# Patient Record
Sex: Female | Born: 1945
Health system: Southern US, Community
[De-identification: ages and names within clinical notes are randomized; demographics above are authoritative.]

## PROBLEM LIST (undated history)

## (undated) DIAGNOSIS — G4730 Sleep apnea, unspecified: Secondary | ICD-10-CM

## (undated) DIAGNOSIS — M1990 Unspecified osteoarthritis, unspecified site: Secondary | ICD-10-CM

## (undated) DIAGNOSIS — I10 Essential (primary) hypertension: Secondary | ICD-10-CM

## (undated) DIAGNOSIS — R0600 Dyspnea, unspecified: Secondary | ICD-10-CM

## (undated) HISTORY — PX: ABDOMINAL HYSTERECTOMY: SHX81

## (undated) HISTORY — PX: CHOLECYSTECTOMY: SHX55

---

## 2004-11-17 ENCOUNTER — Encounter: Payer: Self-pay | Admitting: General Practice

## 2004-11-30 ENCOUNTER — Encounter: Payer: Self-pay | Admitting: General Practice

## 2005-08-02 HISTORY — PX: KNEE ARTHROSCOPY: SHX127

## 2013-08-30 ENCOUNTER — Ambulatory Visit: Payer: Self-pay | Admitting: Physician Assistant

## 2015-06-03 ENCOUNTER — Ambulatory Visit (INDEPENDENT_AMBULATORY_CARE_PROVIDER_SITE_OTHER): Payer: PPO

## 2015-06-03 ENCOUNTER — Ambulatory Visit
Admission: EM | Admit: 2015-06-03 | Discharge: 2015-06-03 | Disposition: A | Discharge status: Home / Self Care | Payer: PPO | Attending: Family Medicine | Admitting: Family Medicine

## 2015-06-03 ENCOUNTER — Encounter: Payer: Self-pay | Admitting: Emergency Medicine

## 2015-06-03 DIAGNOSIS — G8929 Other chronic pain: Secondary | ICD-10-CM

## 2015-06-03 DIAGNOSIS — M545 Low back pain, unspecified: Secondary | ICD-10-CM

## 2015-06-03 DIAGNOSIS — M25562 Pain in left knee: Secondary | ICD-10-CM

## 2015-06-03 DIAGNOSIS — M47819 Spondylosis without myelopathy or radiculopathy, site unspecified: Secondary | ICD-10-CM

## 2015-06-03 DIAGNOSIS — M129 Arthropathy, unspecified: Secondary | ICD-10-CM

## 2015-06-03 DIAGNOSIS — M1712 Unilateral primary osteoarthritis, left knee: Secondary | ICD-10-CM

## 2015-06-03 MED ORDER — CELECOXIB 200 MG PO CAPS: 200.0000 mg | ORAL_CAPSULE | Freq: Two times a day (BID) | ORAL | Status: DC

## 2015-06-03 NOTE — Discharge Instructions (Signed)
Arthritis Arthritis means joint pain. It can also mean joint disease. A joint is a place where bones come together. People who have arthritis may have:  Red joints.  Swollen joints.  Stiff joints.  Warm joints.  A fever.  A feeling of being sick. HOME CARE Pay attention to any changes in your symptoms. Take these actions to help with your pain and swelling. Medicines  Take over-the-counter and prescription medicines only as told by your doctor.  Do not take aspirin for pain if your doctor says that you may have gout. Activities  Rest your joint if your doctor tells you to.  Avoid activities that make the pain worse.  Exercise your joint regularly as told by your doctor. Try doing exercises like:  Swimming.  Water aerobics.  Biking.  Walking. Joint Care 1. If your joint is swollen, keep it raised (elevated) if told by your doctor. 2. If your joint feels stiff in the morning, try taking a warm shower. 3. If you have diabetes, do not apply heat without asking your doctor. 4. If told, apply heat to the joint: 1. Put a towel between the joint and the hot pack or heating pad. 2. Leave the heat on the area for 20-30 minutes. 5. If told, apply ice to the joint: 1. Put ice in a plastic bag. 2. Place a towel between your skin and the bag. 3. Leave the ice on for 20 minutes, 2-3 times per day. 6. Keep all follow-up visits as told by your doctor. GET HELP IF: 1. The pain gets worse. 2. You have a fever. GET HELP RIGHT AWAY IF: 1. You have very bad pain in your joint. 2. You have swelling in your joint. 3. Your joint is red. 4. Many joints become painful and swollen. 5. You have very bad back pain. 6. Your leg is very weak. 7. You cannot control your pee (urine) or poop (stool).   This information is not intended to replace advice given to you by your health care provider. Make sure you discuss any questions you have with your health care provider.   Document  Released: 10/13/2009 Document Revised: 04/09/2015 Document Reviewed: 10/14/2014 Elsevier Interactive Patient Education 2016 [employer]  Back Exercises If you have pain in your back, do these exercises 2-3 times each day or as told by your doctor. When the pain goes away, do the exercises once each day, but repeat the steps more times for each exercise (do more repetitions). If you do not have pain in your back, do these exercises once each day or as told by your doctor. EXERCISES Single Knee to Chest Do these steps 3-5 times in a row for each leg:  Lie on your back on a firm bed or the floor with your legs stretched out.  Bring one knee to your chest.  Hold your knee to your chest by grabbing your knee or thigh.  Pull on your knee until you feel a gentle stretch in your lower back.  Keep doing the stretch for 10-30 seconds.  Slowly let go of your leg and straighten it. Pelvic Tilt Do these steps 5-10 times in a row:  Lie on your back on a firm bed or the floor with your legs stretched out.  Bend your knees so they point up to the ceiling. Your feet should be flat on the floor.  Tighten your lower belly (abdomen) muscles to press your lower back against the floor. This will make your tailbone point  up to the ceiling instead of pointing down to your feet or the floor.  Stay in this position for 5-10 seconds while you gently tighten your muscles and breathe evenly. Cat-Cow Do these steps until your lower back bends more easily:  Get on your hands and knees on a firm surface. Keep your hands under your shoulders, and keep your knees under your hips. You may put padding under your knees.  Let your head hang down, and make your tailbone point down to the floor so your lower back is round like the back of a cat.  Stay in this position for 5 seconds.  Slowly lift your head and make your tailbone point up to the ceiling so your back hangs low (sags) like the back of a cow.  Stay  in this position for 5 seconds. Press-Ups Do these steps 5-10 times in a row: 7. Lie on your belly (face-down) on the floor. 8. Place your hands near your head, about shoulder-width apart. 9. While you keep your back relaxed and keep your hips on the floor, slowly straighten your arms to raise the top half of your body and lift your shoulders. Do not use your back muscles. To make yourself more comfortable, you may change where you place your hands. 10. Stay in this position for 5 seconds. 11. Slowly return to lying flat on the floor. Bridges Do these steps 10 times in a row: 3. Lie on your back on a firm surface. 4. Bend your knees so they point up to the ceiling. Your feet should be flat on the floor. 5. Tighten your butt muscles and lift your butt off of the floor until your waist is almost as high as your knees. If you do not feel the muscles working in your butt and the back of your thighs, slide your feet 1-2 inches farther away from your butt. 6. Stay in this position for 3-5 seconds. 7. Slowly lower your butt to the floor, and let your butt muscles relax. If this exercise is too easy, try doing it with your arms crossed over your chest. Belly Crunches Do these steps 5-10 times in a row: 8. Lie on your back on a firm bed or the floor with your legs stretched out. 9. Bend your knees so they point up to the ceiling. Your feet should be flat on the floor. 10. Cross your arms over your chest. 11. Tip your chin a little bit toward your chest but do not bend your neck. 12. Tighten your belly muscles and slowly raise your chest just enough to lift your shoulder blades a tiny bit off of the floor. 13. Slowly lower your chest and your head to the floor. Back Lifts Do these steps 5-10 times in a row: 1. Lie on your belly (face-down) with your arms at your sides, and rest your forehead on the floor. 2. Tighten the muscles in your legs and your butt. 3. Slowly lift your chest off of the floor  while you keep your hips on the floor. Keep the back of your head in line with the curve in your back. Look at the floor while you do this. 4. Stay in this position for 3-5 seconds. 5. Slowly lower your chest and your face to the floor. GET HELP IF:  Your back pain gets a lot worse when you do an exercise.  Your back pain does not lessen 2 hours after you exercise. If you have any of these problems, stop  doing the exercises. Do not do them again unless your doctor says it is okay. GET HELP RIGHT AWAY IF:  You have sudden, very bad back pain. If this happens, stop doing the exercises. Do not do them again unless your doctor says it is okay.   This information is not intended to replace advice given to you by your health care provider. Make sure you discuss any questions you have with your health care provider.   Document Released: 08/21/2010 Document Revised: 04/09/2015 Document Reviewed: 09/12/2014 Elsevier Interactive Patient Education 2016 [employer].  Joint Pain Joint pain can be caused by many things. The joint can be bruised, infected, weak from aging, or sore from exercise. The pain will probably go away if you follow your doctor's instructions for home care. If your joint pain continues, more tests may be needed to help find the cause of your condition. HOME CARE Watch your condition for any changes. Follow these instructions as told to lessen the pain that you are feeling:  Take medicines only as told by your doctor.  Rest the sore joint for as long as told by your doctor. If your doctor tells you to, raise (elevate) the painful joint above the level of your heart while you are sitting or lying down.  Do not do things that cause pain or make the pain worse.  If told, put ice on the painful area:  Put ice in a plastic bag.  Place a towel between your skin and the bag.  Leave the ice on for 20 minutes, 2-3 times per day.  Wear an elastic bandage, splint, or sling as told  by your doctor. Loosen the bandage or splint if your fingers or toes lose feeling (become numb) and tingle, or if they turn cold and blue.  Begin exercising or stretching the joint as told by your doctor. Ask your doctor what types of exercise are safe for you.  Keep all follow-up visits as told by your doctor. This is important. GET HELP IF:  Your pain gets worse and medicine does not help it.  Your joint pain does not get better in 3 days.  You have more bruising or swelling.  You have a fever.  You lose 10 pounds (4.5 kg) or more without trying. GET HELP RIGHT AWAY IF:  You are not able to move the joint.  Your fingers or toes become numb or they turn cold and blue.   This information is not intended to replace advice given to you by your health care provider. Make sure you discuss any questions you have with your health care provider.   Document Released: 07/07/2009 Document Revised: 08/09/2014 Document Reviewed: 04/30/2014 Elsevier Interactive Patient Education 2016 [employer] Inc[employer]  Knee Pain Knee pain is a common problem. It can have many causes. The pain often goes away by following your doctor's home care instructions. Treatment for ongoing pain will depend on the cause of your pain. If your knee pain continues, more tests may be needed to diagnose your condition. Tests may include X-rays or other imaging studies of your knee. HOME CARE  Take medicines only as told by your doctor.  Rest your knee and keep it raised (elevated) while you are resting.  Do not do things that cause pain or make your pain worse.  Avoid activities where both feet leave the ground at the same time, such as running, jumping rope, or doing jumping jacks.  Apply ice to the knee area:  Put ice  in a plastic bag.  Place a towel between your skin and the bag.  Leave the ice on for 20 minutes, 2-3 times a day.  Ask your doctor if you should wear an elastic knee support.  Sleep with a pillow  under your knee.  Lose weight if you are overweight. Being overweight can make your knee hurt more.  Do not use any tobacco products, including cigarettes, chewing tobacco, or electronic cigarettes. If you need help quitting, ask your doctor. Smoking may slow the healing of any bone and joint problems that you may have. GET HELP IF:  Your knee pain does not stop, it changes, or it gets worse.  You have a fever along with knee pain.  Your knee gives out or locks up.  Your knee becomes more swollen. GET HELP RIGHT AWAY IF:   Your knee feels hot to the touch.  You have chest pain or trouble breathing.   This information is not intended to replace advice given to you by your health care provider. Make sure you discuss any questions you have with your health care provider.   Document Released: 10/15/2008 Document Revised: 08/09/2014 Document Reviewed: 09/19/2013 Elsevier Interactive Patient Education [employer]

## 2015-06-03 NOTE — ED Notes (Signed)
Patient states she has been having left knee and back pain since June. Patient saw her PCP and got a muscle relaxer but it didn't help.

## 2015-06-03 NOTE — ED Provider Notes (Signed)
CSN: [id number]     Arrival date & time 06/03/15  7829 History   First MD Initiated Contact with Patient 06/03/15 0932    Nurses notes were reviewed. Chief Complaint  Patient presents with  . Knee Pain   she reports having knee pain for months. She started having back pain but to 3 months ago she's had a history of previous injury to the back which was pregnant falling down some steps which is over 30 years ago she also reports Dr. Ivar Drape timing of that she had some degenerative changes in the spine. She saw PCP a few months ago because of pain in the back and he talked about placed on narcotic which she wasn't too thrilled with. Asked to 3 days increased not only left knee pain and also back pain last night she laid in bed and sleep through the night because of pain in the back and in the left knee. She is a point she wants some answers as far as was causing the pain in the back and left knee. (Consider location/radiation/quality/duration/timing/severity/associated sxs/prior Treatment) Patient is a 69 y.o. female presenting with knee pain. The history is provided by the patient. No language interpreter was used.  Knee Pain Location:  Knee Knee location:  L knee Pain details:    Quality:  Aching and throbbing   Radiates to:  Does not radiate   Severity:  Moderate   Onset quality:  Unable to specify   Timing:  Intermittent   Progression:  Worsening Chronicity:  Chronic Foreign body present:  No foreign bodies Prior injury to area:  No Relieved by:  Nothing Worsened by:  Activity and bearing weight Associated symptoms: back pain, decreased ROM and stiffness   Associated symptoms: no muscle weakness and no neck pain   Risk factors: no concern for non-accidental trauma, no frequent fractures, no known bone disorder, no obesity and no recent illness     History reviewed. No pertinent past medical history. Past Surgical History  Procedure Laterality Date  . Knee arthroscopy Right 2007  .  Cholecystectomy     History reviewed. No pertinent family history. Social History  Substance Use Topics  . Smoking status: Light Tobacco Smoker  . Smokeless tobacco: None  . Alcohol Use: No   OB History    No data available     Review of Systems  Musculoskeletal: Positive for back pain and stiffness. Negative for neck pain.  All other systems reviewed and are negative.   Allergies  Contrast media  Home Medications   Prior to Admission medications   Medication Sig Start Date End Date Taking? Authorizing Provider  celecoxib (CELEBREX) 200 MG capsule Take 1 capsule (200 mg total) by mouth 2 (two) times daily. 06/03/15   Hassan Rowan, MD   Meds Ordered and Administered this Visit  Medications - No data to display  BP 143/78 mmHg  Pulse 87  Temp(Src) 96.2 F (35.7 C) (Tympanic)  Resp 18  Ht  (1.6 m)  Wt 199 lb (90.266 kg)  BMI 35.26 kg/m2  SpO2 99% No data found.   Physical Exam  Constitutional: She is oriented to person, place, and time. She appears well-developed and well-nourished.  HENT:  Head: Normocephalic and atraumatic.  Eyes: Conjunctivae are normal. Pupils are equal, round, and reactive to light.  Musculoskeletal: Normal range of motion. She exhibits tenderness. She exhibits no edema.       Left knee: She exhibits swelling. Tenderness found.  Lumbar back: She exhibits tenderness and pain.       Legs: Pain tenderness lower back and sacral coccyx area. Left knee is also tender to palpation slightly swollen  Neurological: She is alert and oriented to person, place, and time. No cranial nerve deficit.  Skin: Skin is warm and dry.  Psychiatric: She has a normal mood and affect. Her behavior is normal.  Vitals reviewed.   ED Course  Procedures (including critical care time)  Labs Review Labs Reviewed - No data to display  Imaging Review Dg Lumbar Spine Complete  06/03/2015  CLINICAL DATA:  Low back and sacral pain for 2 months, no acute injury  EXAM: LUMBAR SPINE - COMPLETE 4+ VIEW COMPARISON:  None. FINDINGS: The lumbar vertebrae are in normal alignment. There is degenerative disc disease at the L5-S1 level where there is loss of disc space, sclerosis, and spurring present. No compression deformity is seen. There is degenerative change involving the facet joints particularly of L4-5 and L5-S1. The SI joints are corticated. The bowel gas pattern is nonspecific. IMPRESSION: 1. Normal alignment with no acute abnormality. 2. Degenerative disc disease at L5-S1. Electronically Signed   By: Dwyane Dee M.D.   On: 06/03/2015 11:31   Dg Sacrum/coccyx  06/03/2015  CLINICAL DATA:  Low back and sacral pain for 2 months, no acute injury EXAM: SACRUM AND COCCYX - 2+ VIEW COMPARISON:  None. FINDINGS: The bones appear somewhat osteopenic. The SI joints are corticated and the sacral foramina appear normal. The pelvic rami are intact. There is degenerative disc disease at L5-S1 where there is loss of disc space, sclerosis, and spurring present. The sacrococcygeal elements are in normal alignment. IMPRESSION: 1. No acute fracture. 2. Degenerative disc disease at L5-S1. Electronically Signed   By: Dwyane Dee M.D.   On: 06/03/2015 11:30   Dg Knee Complete 4 Views Left  06/03/2015  CLINICAL DATA:  69 year old female with anterior left knee pain for the past 2 months. Hurts to bear weight. No history of recent injury. Initial encounter. EXAM: LEFT KNEE - COMPLETE 4+ VIEW COMPARISON:  None. FINDINGS: Moderate to marked medial tibiofemoral joint space degenerative changes with osteophyte formation and mild subchondral sclerosis. Slightly high riding patella. Mild to slightly moderate patellofemoral joint degenerative changes. Mild lateral tibial femoral joint space changes. Small joint effusion. No fracture or dislocation. IMPRESSION: Moderate to marked medial tibiofemoral joint space degenerative changes with osteophyte formation and mild subchondral sclerosis. Slightly  high riding patella. Mild to slightly moderate patellofemoral joint degenerative changes. Mild lateral tibial femoral joint space changes. Small joint effusion. Electronically Signed   By: Lacy Duverney M.D.   On: 06/03/2015 11:30     Visual Acuity Review  Right Eye Distance:   Left Eye Distance:   Bilateral Distance:    Right Eye Near:   Left Eye Near:    Bilateral Near:         MDM   1. Knee pain, chronic, left   2. Back pain at L4-L5 level   3. Arthritis, low back   4. Arthritis of knee, left     Patient was given expiration that she has severe arthritis of the knee and because she has authorized the knee sure that suspecting her gait making her back worse when turned that we need to get her to an orthopedic to have any evaluated cortisone injection and possible knee replacements by me needed and until those things done that she's got have chronic back pain. Discussed with  her anti-inflammatories I do not like Motrin or Naprosyn in the elderly patients but we can use a low-dose Celebrex 200 mg until she sees the orthopedic. She has been to trying orthopedic to have a right knee operated on so she is going to go there and will try to get the phone number so she can make a call to get an appointment. She had to leave to take care of her disabled husband assists her come back and get the films and discharge papers and prescription.    Hassan RowanEugene Cailen Texeira, MD 06/03/15 [phone]

## 2015-06-16 DIAGNOSIS — G4733 Obstructive sleep apnea (adult) (pediatric): Secondary | ICD-10-CM | POA: Insufficient documentation

## 2015-07-08 DIAGNOSIS — M47816 Spondylosis without myelopathy or radiculopathy, lumbar region: Secondary | ICD-10-CM | POA: Insufficient documentation

## 2015-08-06 DIAGNOSIS — G4733 Obstructive sleep apnea (adult) (pediatric): Secondary | ICD-10-CM | POA: Diagnosis not present

## 2015-08-06 DIAGNOSIS — H60331 Swimmer's ear, right ear: Secondary | ICD-10-CM | POA: Diagnosis not present

## 2015-08-18 DIAGNOSIS — G4733 Obstructive sleep apnea (adult) (pediatric): Secondary | ICD-10-CM | POA: Diagnosis not present

## 2015-08-27 DIAGNOSIS — G4733 Obstructive sleep apnea (adult) (pediatric): Secondary | ICD-10-CM | POA: Diagnosis not present

## 2015-09-15 DIAGNOSIS — L218 Other seborrheic dermatitis: Secondary | ICD-10-CM | POA: Diagnosis not present

## 2015-09-15 DIAGNOSIS — L2489 Irritant contact dermatitis due to other agents: Secondary | ICD-10-CM | POA: Diagnosis not present

## 2015-09-15 DIAGNOSIS — L719 Rosacea, unspecified: Secondary | ICD-10-CM | POA: Diagnosis not present

## 2015-09-27 DIAGNOSIS — G4733 Obstructive sleep apnea (adult) (pediatric): Secondary | ICD-10-CM | POA: Diagnosis not present

## 2015-10-25 DIAGNOSIS — G4733 Obstructive sleep apnea (adult) (pediatric): Secondary | ICD-10-CM | POA: Diagnosis not present

## 2015-11-25 DIAGNOSIS — G4733 Obstructive sleep apnea (adult) (pediatric): Secondary | ICD-10-CM | POA: Diagnosis not present

## 2015-12-25 DIAGNOSIS — G4733 Obstructive sleep apnea (adult) (pediatric): Secondary | ICD-10-CM | POA: Diagnosis not present

## 2016-01-25 DIAGNOSIS — G4733 Obstructive sleep apnea (adult) (pediatric): Secondary | ICD-10-CM | POA: Diagnosis not present

## 2016-02-24 DIAGNOSIS — G4733 Obstructive sleep apnea (adult) (pediatric): Secondary | ICD-10-CM | POA: Diagnosis not present

## 2016-03-03 ENCOUNTER — Encounter: Payer: Self-pay | Admitting: Emergency Medicine

## 2016-03-03 ENCOUNTER — Ambulatory Visit
Admission: EM | Admit: 2016-03-03 | Discharge: 2016-03-03 | Disposition: A | Discharge status: Home / Self Care | Payer: PPO | Attending: Emergency Medicine | Admitting: Emergency Medicine

## 2016-03-03 ENCOUNTER — Ambulatory Visit (INDEPENDENT_AMBULATORY_CARE_PROVIDER_SITE_OTHER): Payer: PPO

## 2016-03-03 DIAGNOSIS — J988 Other specified respiratory disorders: Secondary | ICD-10-CM

## 2016-03-03 DIAGNOSIS — R059 Cough, unspecified: Secondary | ICD-10-CM

## 2016-03-03 DIAGNOSIS — R05 Cough: Secondary | ICD-10-CM | POA: Diagnosis not present

## 2016-03-03 DIAGNOSIS — R0602 Shortness of breath: Secondary | ICD-10-CM | POA: Diagnosis not present

## 2016-03-03 DIAGNOSIS — J22 Unspecified acute lower respiratory infection: Secondary | ICD-10-CM

## 2016-03-03 HISTORY — DX: Unspecified osteoarthritis, unspecified site: M19.90

## 2016-03-03 MED ORDER — HYDROCOD POLST-CPM POLST ER 10-8 MG/5ML PO SUER: 5.0000 mL | Freq: Two times a day (BID) | ORAL | 0 refills | Status: DC | PRN

## 2016-03-03 MED ORDER — IPRATROPIUM BROMIDE 0.06 % NA SOLN: 2.0000 | Freq: Four times a day (QID) | NASAL | 0 refills | Status: DC

## 2016-03-03 MED ORDER — AZITHROMYCIN 250 MG PO TABS: 250.0000 mg | ORAL_TABLET | Freq: Every day | ORAL | 0 refills | Status: DC

## 2016-03-03 MED ORDER — BENZONATATE 200 MG PO CAPS: 200.0000 mg | ORAL_CAPSULE | Freq: Three times a day (TID) | ORAL | 0 refills | Status: DC | PRN

## 2016-03-03 MED ORDER — AEROCHAMBER PLUS MISC: 2 refills | Status: DC

## 2016-03-03 MED ORDER — ALBUTEROL SULFATE HFA 108 (90 BASE) MCG/ACT IN AERS: 1.0000 | INHALATION_SPRAY | Freq: Four times a day (QID) | RESPIRATORY_TRACT | 0 refills | Status: DC | PRN

## 2016-03-03 NOTE — ED Triage Notes (Signed)
Pt reports coughing since Saturday, difficulty sleeping, SOB, and rib pain when coughs.

## 2016-03-03 NOTE — Discharge Instructions (Signed)
Take the medication as written. Take 1 gram of tylenol with the 400 mg of motrin up to 3-4 times a day as needed for pain and fever. This is an effective combination. Drink extra fluids. Use a neti pot or the NeilMed sinus rinse as often as you want to to reduce nasal congestion. Follow the directions on the box. 1-2 puffs from your albuterol inhaler every 4-6 hours as needed for coughing. Be careful with the Tussionex as it has a narcotic in it and this could increase your risk for falls.  Go to www.goodrx.com to look up your medications. This will give you a list of where you can find your prescriptions at the most affordable prices.

## 2016-03-03 NOTE — ED Provider Notes (Signed)
HPI  SUBJECTIVE:  Kaitlyn Sanders is a 70 y.o. female who presents with a nonproductive cough for 4 days. She states that she cannot sleep secondary to the cough. She reports ear pain, sinus pain and pressure with coughing, chest soreness with coughing, nasal congestion, clear rhinorrhea, postnasal drip. She reports fevers Tmax 102 yesterday. She reports fatigue secondary to not being able to sleep. She denies change in hearing, otorrhea, foreign body insertion. She reports wheezing, other chest pain, or other shortness of breath. No dyspnea on exertion. No bodyaches. No lower extremity edema, unintentional weight gain, nocturia, orthopnea, PND. No abdominal complaints or pain. 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 She has tried aspirin or ibuprofen once a day, last dose was last night. No alleviating factors. Symptoms worse with lying down because this triggers for coughing. No tick bite, no antipyretic in the past 6-8 hours. No posttussive emesis. Past medical history of obstructive sleep apnea on CPAP, 56 year history of smoking 3-4 cigarettes per day, pneumonia. No history of asthma, emphysema, COPD, diabetes, hypertension, CHF, kidney disease, GI bleed, peptic ulcer disease. PMD: Dr. Elmer Ramp.    Past Medical History:  Diagnosis Date  . Arthritis     Past Surgical History:  Procedure Laterality Date  . ABDOMINAL HYSTERECTOMY    . CHOLECYSTECTOMY    . KNEE ARTHROSCOPY Right 2007    History reviewed. No pertinent family history.  Social History  Substance Use Topics  . Smoking status: Light Tobacco Smoker  . Smokeless tobacco: Never Used  . Alcohol use No    No current facility-administered medications for this encounter.   Current Outpatient Prescriptions:  .  albuterol (PROVENTIL HFA;VENTOLIN HFA) 108 (90 Base) MCG/ACT inhaler, Inhale 1-2 puffs into the lungs every 6 (six) hours as needed for wheezing or  shortness of breath., Disp: 1 Inhaler, Rfl: 0 .  azithromycin (ZITHROMAX) 250 MG tablet, Take 1 tablet (250 mg total) by mouth daily. 2 tabs po on day one, then one tablet po once daily on days 2-5., Disp: 6 tablet, Rfl: 0 .  benzonatate (TESSALON) 200 MG capsule, Take 1 capsule (200 mg total) by mouth 3 (three) times daily as needed for cough., Disp: 30 capsule, Rfl: 0 .  celecoxib (CELEBREX) 200 MG capsule, Take 1 capsule (200 mg total) by mouth 2 (two) times daily., Disp: 30 capsule, Rfl: 0 .  chlorpheniramine-HYDROcodone (TUSSIONEX PENNKINETIC ER) 10-8 MG/5ML SUER, Take 5 mLs by mouth every 12 (twelve) hours as needed for cough., Disp: 120 mL, Rfl: 0 .  ipratropium (ATROVENT) 0.06 % nasal spray, Place 2 sprays into both nostrils 4 (four) times daily. 3-4 times/ day, Disp: 15 mL, Rfl: 0 .  Spacer/Aero-Holding Chambers (AEROCHAMBER PLUS) inhaler, Use as instructed, Disp: 1 each, Rfl: 2  Allergies  Allergen Reactions  . Bee Venom Anaphylaxis  . Contrast Media [Iodinated Diagnostic Agents]      ROS  As noted in HPI.   Physical Exam  BP 138/69   Pulse 83   Temp 97.9 F (36.6 C) (Oral)   Resp (!) 22   Ht  (1.6 m)   Wt 192 lb (87.1 kg)   SpO2 97%   BMI 34.01 kg/m   Constitutional: Well developed, well nourished, no acute distress Eyes:  EOMI, conjunctiva normal bilaterally HENT: Normocephalic, atraumatic,mucus membranes moist. TMs normal bilaterally. Positive nasal congestion. No sinus tenderness. Oropharynx normal. Uvula midline. Mild postnasal drip., Good air  movement, rhonchi/rales in the left upper lobe. No wheezing. Respiratory: Normal inspiratory effort Cardiovascular: Normal rate GI: nondistended skin: No rash, skin intact Musculoskeletal: no deformities Neurologic: Alert & oriented x 3, no focal neuro deficits Psychiatric: Speech and behavior appropriate   ED Course   Medications - No data to display  Orders Placed This Encounter  Procedures  . DG Chest 2  View    Standing Status:   Standing    Number of Occurrences:   1    Order Specific Question:   Reason for Exam (SYMPTOM  OR DIAGNOSIS REQUIRED)    Answer:   r/o PNA    No results found for this or any previous visit (from the past 24 hour(s)). Dg Chest 2 View  Result Date: 03/03/2016 CLINICAL DATA:  Cough, shortness of breath. EXAM: CHEST  2 VIEW COMPARISON:  Radiographs of August 30, 2013. FINDINGS: The heart size and mediastinal contours are within normal limits. Both lungs are clear. No pneumothorax or pleural effusion is noted. Atherosclerosis of thoracic aorta is noted. The visualized skeletal structures are unremarkable. IMPRESSION: No active cardiopulmonary disease.  Aortic atherosclerosis. Electronically Signed   By: Lupita Raider, M.D.   On: 03/03/2016 15:44    ED Clinical Impression  LRTI (lower respiratory tract infection)  Cough  ED Assessment/Plan  We'll check chest x-ray to rule out any consolidation, however, plan to send home on antibiotics because of the fevers, age, history of smoking. suspect that she has some structural lung disease. Plan to send home on azithromycin Z-Pak, albuterol spacer, Tussionex, although advised to be careful of this as this could increase her risk of falls, Tessalon Perles. Advised ibuprofen 400 mg 3-4 times a day. Also saline nasal irrigation, Atrovent nasal spray.  X-ray independently reviewed. No pneumonia. See radiology report for full details. Plan as above.  Discussed  imaging, MDM, plan and followup with patient. Discussed sn/sx that should prompt return to the ED. Patient agrees with plan.   *This clinic note was created using Dragon dictation software. Therefore, there may be occasional mistakes despite careful proofreading.  ?   Domenick Gong, MD 03/03/16 [phone]

## 2016-03-26 DIAGNOSIS — G4733 Obstructive sleep apnea (adult) (pediatric): Secondary | ICD-10-CM | POA: Diagnosis not present

## 2016-04-07 DIAGNOSIS — G4733 Obstructive sleep apnea (adult) (pediatric): Secondary | ICD-10-CM | POA: Diagnosis not present

## 2016-04-13 DIAGNOSIS — G4733 Obstructive sleep apnea (adult) (pediatric): Secondary | ICD-10-CM | POA: Diagnosis not present

## 2016-04-26 DIAGNOSIS — G4733 Obstructive sleep apnea (adult) (pediatric): Secondary | ICD-10-CM | POA: Diagnosis not present

## 2016-05-26 DIAGNOSIS — G4733 Obstructive sleep apnea (adult) (pediatric): Secondary | ICD-10-CM | POA: Diagnosis not present

## 2016-06-26 DIAGNOSIS — G4733 Obstructive sleep apnea (adult) (pediatric): Secondary | ICD-10-CM | POA: Diagnosis not present

## 2017-03-30 DIAGNOSIS — M1712 Unilateral primary osteoarthritis, left knee: Secondary | ICD-10-CM | POA: Diagnosis not present

## 2017-03-30 DIAGNOSIS — M1811 Unilateral primary osteoarthritis of first carpometacarpal joint, right hand: Secondary | ICD-10-CM | POA: Insufficient documentation

## 2017-03-30 DIAGNOSIS — M25562 Pain in left knee: Secondary | ICD-10-CM | POA: Diagnosis not present

## 2017-04-07 DIAGNOSIS — H6123 Impacted cerumen, bilateral: Secondary | ICD-10-CM | POA: Diagnosis not present

## 2017-06-14 DIAGNOSIS — G4733 Obstructive sleep apnea (adult) (pediatric): Secondary | ICD-10-CM | POA: Diagnosis not present

## 2017-12-16 DIAGNOSIS — G4733 Obstructive sleep apnea (adult) (pediatric): Secondary | ICD-10-CM | POA: Diagnosis not present

## 2018-04-13 IMAGING — CR DG CHEST 2V
2 series · 2 of 2 positions shown · non-contrast
Comparison: Radiographs August 30, 2013.

CLINICAL DATA: Cough, shortness of breath.

EXAM:
CHEST  2 VIEW

[chest pa]
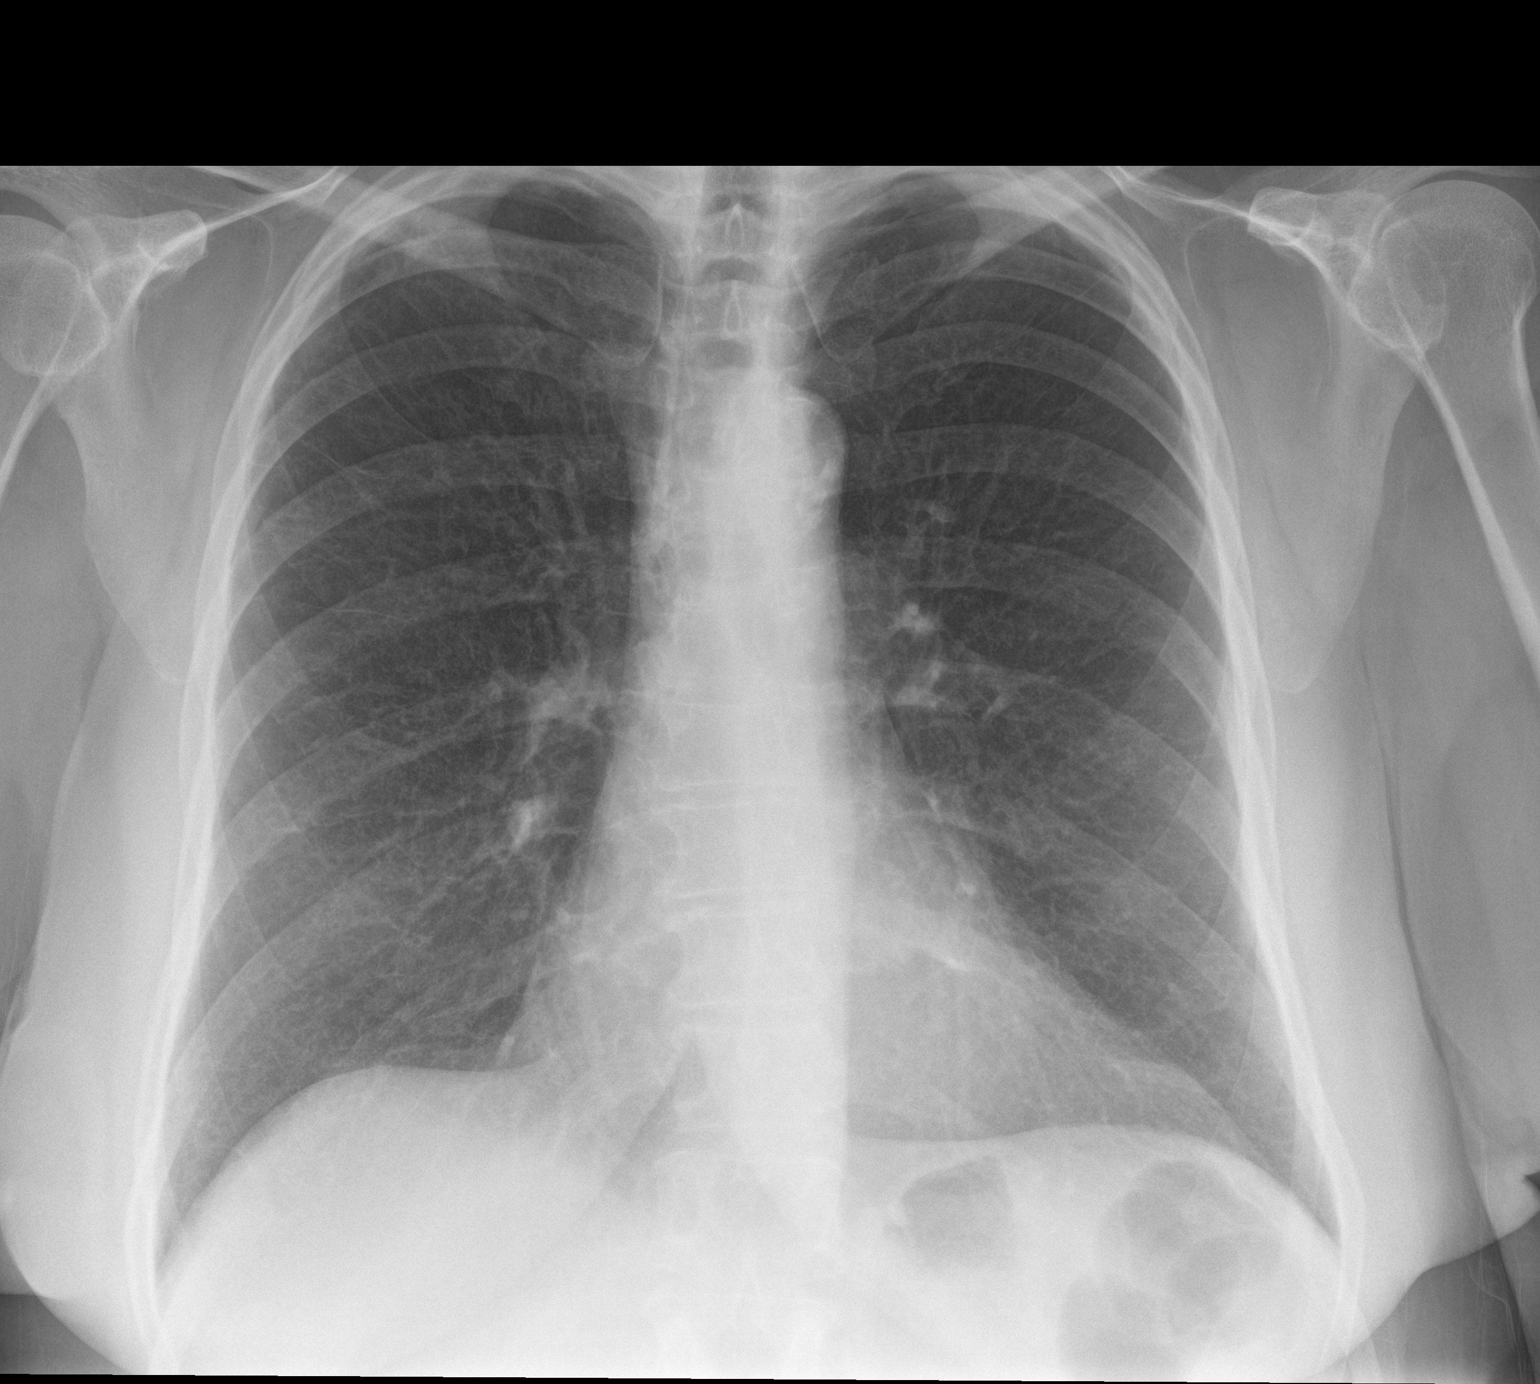

[chest lat]
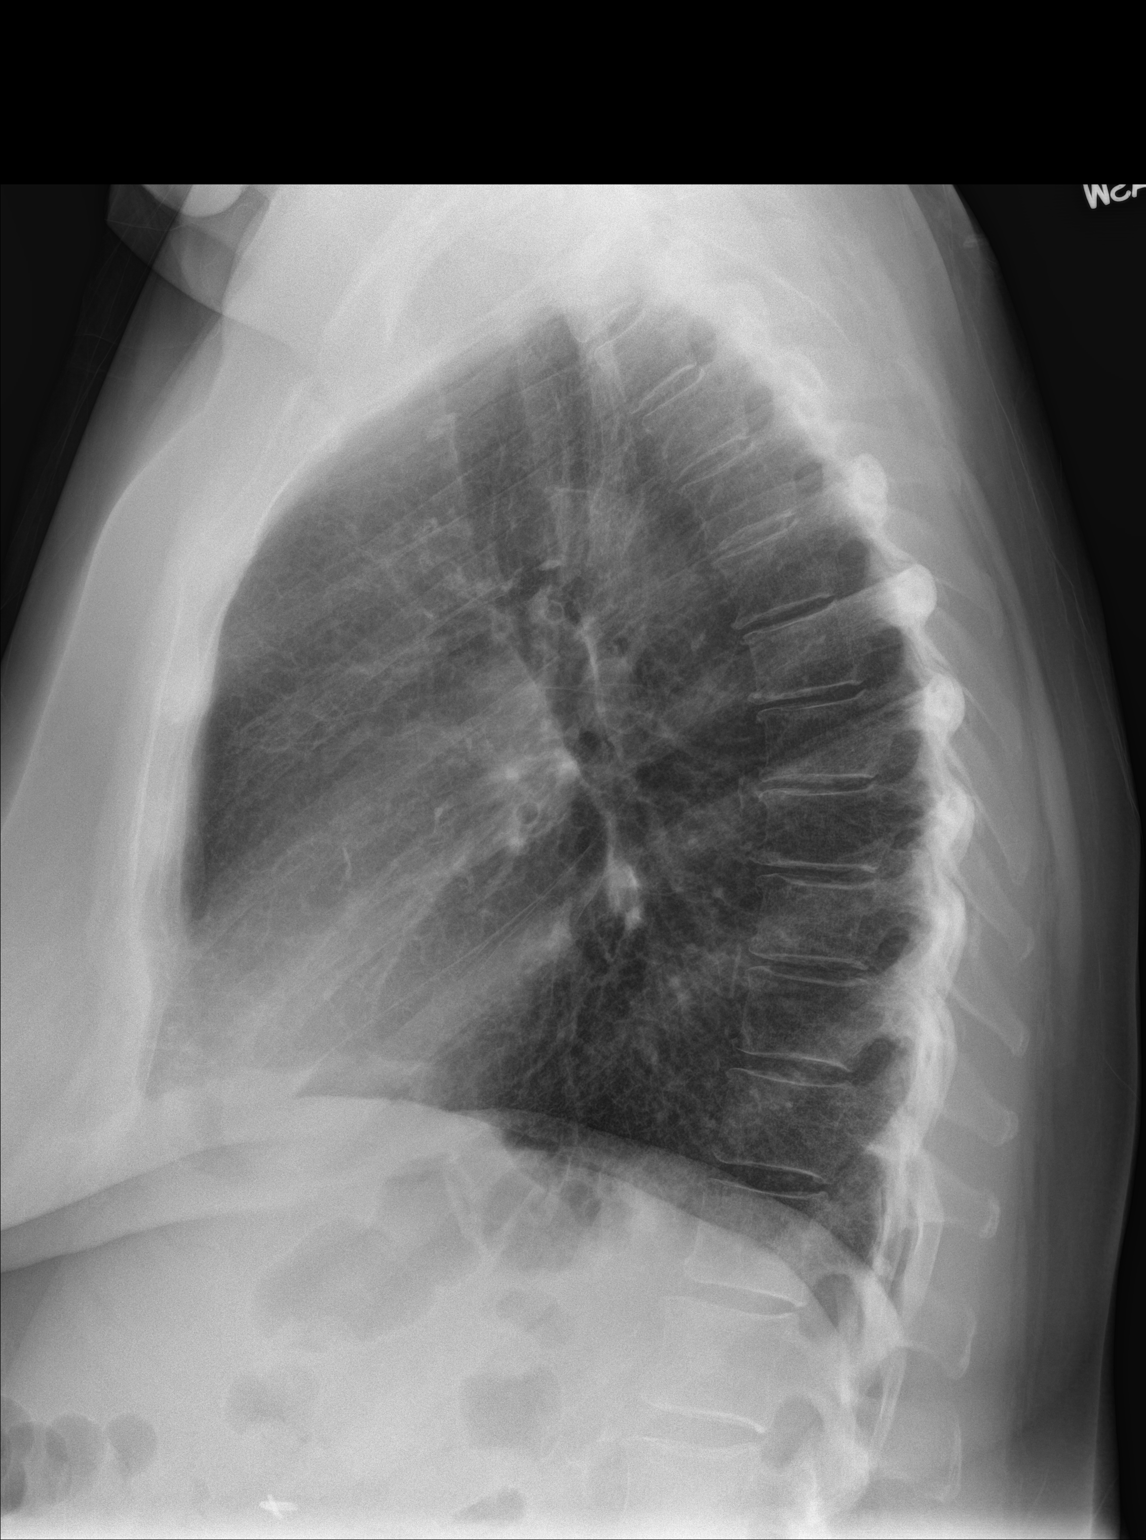

[2 of 2 positions shown; findings below may reference images not displayed]

FINDINGS: The heart size and mediastinal contours are within normal limits.
Both lungs are clear. No pneumothorax or pleural effusion is noted.
Atherosclerosis of thoracic aorta is noted. The visualized skeletal
structures are unremarkable.
IMPRESSION: No active cardiopulmonary disease.  Aortic atherosclerosis.

## 2018-07-17 DIAGNOSIS — G4733 Obstructive sleep apnea (adult) (pediatric): Secondary | ICD-10-CM | POA: Diagnosis not present

## 2018-07-18 ENCOUNTER — Other Ambulatory Visit: Payer: Self-pay

## 2018-07-18 ENCOUNTER — Ambulatory Visit
Admission: EM | Admit: 2018-07-18 | Discharge: 2018-07-18 | Disposition: A | Discharge status: Home / Self Care | Payer: PPO | Attending: Family Medicine | Admitting: Family Medicine

## 2018-07-18 DIAGNOSIS — J069 Acute upper respiratory infection, unspecified: Secondary | ICD-10-CM | POA: Insufficient documentation

## 2018-07-18 DIAGNOSIS — B9789 Other viral agents as the cause of diseases classified elsewhere: Secondary | ICD-10-CM

## 2018-07-18 DIAGNOSIS — H6121 Impacted cerumen, right ear: Secondary | ICD-10-CM | POA: Diagnosis not present

## 2018-07-18 MED ORDER — TRAZODONE HCL 100 MG PO TABS: 100.0000 mg | ORAL_TABLET | Freq: Every evening | ORAL | 0 refills | Status: AC | PRN

## 2018-07-18 MED ORDER — IPRATROPIUM BROMIDE 0.06 % NA SOLN: 2.0000 | Freq: Four times a day (QID) | NASAL | 0 refills | Status: DC | PRN

## 2018-07-18 MED ORDER — BENZONATATE 100 MG PO CAPS: 100.0000 mg | ORAL_CAPSULE | Freq: Three times a day (TID) | ORAL | 0 refills | Status: DC | PRN

## 2018-07-18 NOTE — ED Triage Notes (Addendum)
Patient complains of cough, congestion, bilateral ear fullness. Patient states that she is depressed due to recent death of her husband. Patient states that she is not sleeping. Patient states that symptoms started last week. Patient states that she tried to call her primary for an appt but couldn't see her until 12/28.

## 2018-07-18 NOTE — ED Provider Notes (Signed)
[facility]    CSN: [id number] Arrival date & time: 07/18/18  1451  History   Chief Complaint Chief Complaint  Patient presents with  . Cough  . Ear Fullness   HPI  72 year old female presents with respiratory symptoms and difficulty sleeping.  Patient reports that her respiratory symptoms started yesterday.  She reports postnasal drip, cough, ear pain and difficulty hearing.  Her ear pain is predominantly on the right ear.  She reports difficulty hearing out of right ear.  No documented fever.  Symptoms are worse at night.  She states that she is having ongoing insomnia as well.  She recently lost her husband approximately 3 weeks ago.  She seems to be struggling with the loss of her spouse.  He states that she does not feel depressed but has difficulty sleeping as it is been a big adjustment.  She has tried Zzzquil  without improvement. No other reported symptoms. No other complaints.  Hx reviewed as below. Past Medical History:  Diagnosis Date  . Arthritis    Past Surgical History:  Procedure Laterality Date  . ABDOMINAL HYSTERECTOMY    . CHOLECYSTECTOMY    . KNEE ARTHROSCOPY Right 2007    OB History   No obstetric history on file.      Home Medications    Prior to Admission medications   Medication Sig Start Date End Date Taking? Authorizing Provider  benzonatate (TESSALON) 100 MG capsule Take 1 capsule (100 mg total) by mouth 3 (three) times daily as needed. 07/18/18   Everlene Otherook, Giuliano Preece G, DO  ipratropium (ATROVENT) 0.06 % nasal spray Place 2 sprays into both nostrils 4 (four) times daily as needed for rhinitis. 07/18/18   Tommie Samsook, Irja Wheless G, DO  traZODone (DESYREL) 100 MG tablet Take 1 tablet (100 mg total) by mouth at bedtime as needed for sleep. 07/18/18   Tommie Samsook, Jacaden Forbush G, DO   Social History Social History   Tobacco Use  . Smoking status: Current Every Day Smoker    Packs/day: 0.50    Types: Cigarettes  . Smokeless tobacco: Never Used  Substance Use  Topics  . Alcohol use: No  . Drug use: Not Currently     Allergies   Bee venom and Contrast media [iodinated diagnostic agents]   Review of Systems Review of Systems  Constitutional: Negative for fever.  HENT: Positive for ear pain, hearing loss and postnasal drip.   Respiratory: Positive for cough.   Psychiatric/Behavioral: Positive for sleep disturbance.   Physical Exam Triage Vital Signs ED Triage Vitals  Enc Vitals Group     BP 07/18/18 1515 129/67     Pulse Rate 07/18/18 1515 93     Resp 07/18/18 1515 18     Temp 07/18/18 1515 98 F (36.7 C)     Temp Source 07/18/18 1515 Oral     SpO2 07/18/18 1515 97 %     Weight 07/18/18 1512 190 lb (86.2 kg)     Height 07/18/18 1512 5\' 3"  (1.6 m)     Head Circumference --      Peak Flow --      Pain Score 07/18/18 1511 6     Pain Loc --      Pain Edu? --      Excl. in GC? --    Updated Vital Signs BP 129/67 (BP Location: Left Arm)   Pulse 93   Temp 98 F (36.7 C) (Oral)   Resp 18   Ht 5\' 3"  (1.6 m)  Wt 86.2 kg   SpO2 97%   BMI 33.66 kg/m   Visual Acuity Right Eye Distance:   Left Eye Distance:   Bilateral Distance:    Right Eye Near:   Left Eye Near:    Bilateral Near:     Physical Exam Vitals signs and nursing note reviewed.  Constitutional:      Appearance: Normal appearance. She is not ill-appearing or toxic-appearing.  HENT:     Head: Normocephalic and atraumatic.     Left Ear: Tympanic membrane and ear canal normal.     Ears:     Comments: Right TM obscured by cerumen.    Mouth/Throat:     Pharynx: Oropharynx is clear.  Eyes:     General:        Right eye: No discharge.        Left eye: No discharge.     Conjunctiva/sclera: Conjunctivae normal.  Cardiovascular:     Rate and Rhythm: Normal rate and regular rhythm.  Pulmonary:     Effort: Pulmonary effort is normal.     Breath sounds: Normal breath sounds. No wheezing, rhonchi or rales.  Neurological:     Mental Status: She is alert.    Psychiatric:        Behavior: Behavior normal.     Comments: Upset when talking about husband. Tearful.    UC Treatments / Results  Labs (all labs ordered are listed, but only abnormal results are displayed) Labs Reviewed - No data to display  EKG None  Radiology No results found.  Procedures Procedures (including critical care time)  Medications Ordered in UC Medications - No data to display  Initial Impression / Assessment and Plan / UC Course  I have reviewed the triage vital signs and the nursing notes.  Pertinent labs & imaging results that were available during my care of the patient were reviewed by me and considered in my medical decision making (see chart for details).    72 year old female presents with a viral URI with cough.  Treating management nasal spray, Tessalon Perles.  Patient found to have cerumen impaction.  Unsuccessful removal today.  If symptoms persist, will need to see ENT.  Regarding her insomnia, this appears to be part of a grief reaction.  Patient not interested in antidepressants at this time.  Patient is talking to her minister.  Patient would like something for sleep.  Trazodone as directed.  Final Clinical Impressions(s) / UC Diagnoses   Final diagnoses:  Viral URI with cough  Impacted cerumen of right ear     Discharge Instructions     Call [facility].  Medications as prescribed.  Take care  Dr. Adriana Simas   ED Prescriptions    Medication Sig Dispense Auth. Provider   ipratropium (ATROVENT) 0.06 % nasal spray Place 2 sprays into both nostrils 4 (four) times daily as needed for rhinitis. 15 mL Nyala Kirchner G, DO   benzonatate (TESSALON) 100 MG capsule Take 1 capsule (100 mg total) by mouth 3 (three) times daily as needed. 30 capsule Colie Josten G, DO   traZODone (DESYREL) 100 MG tablet Take 1 tablet (100 mg total) by mouth at bedtime as needed for sleep. 30 tablet Tommie Sams, DO     Controlled Substance Prescriptions [place]  Controlled Substance Registry consulted? Not Applicable   Tommie Sams, DO 07/18/18 1723

## 2018-07-18 NOTE — Discharge Instructions (Signed)
Call [facility].  Medications as prescribed.  Take care  Dr. Adriana Simasook

## 2018-07-19 DIAGNOSIS — M1712 Unilateral primary osteoarthritis, left knee: Secondary | ICD-10-CM | POA: Diagnosis not present

## 2018-07-28 DIAGNOSIS — G4700 Insomnia, unspecified: Secondary | ICD-10-CM | POA: Diagnosis not present

## 2018-07-28 DIAGNOSIS — F17200 Nicotine dependence, unspecified, uncomplicated: Secondary | ICD-10-CM | POA: Diagnosis not present

## 2018-07-28 DIAGNOSIS — J069 Acute upper respiratory infection, unspecified: Secondary | ICD-10-CM | POA: Diagnosis not present

## 2018-08-14 DIAGNOSIS — H9190 Unspecified hearing loss, unspecified ear: Secondary | ICD-10-CM | POA: Diagnosis not present

## 2018-08-14 DIAGNOSIS — H6123 Impacted cerumen, bilateral: Secondary | ICD-10-CM | POA: Diagnosis not present

## 2018-09-06 ENCOUNTER — Other Ambulatory Visit (HOSPITAL_COMMUNITY): Payer: Self-pay | Admitting: Surgery

## 2018-09-06 ENCOUNTER — Other Ambulatory Visit: Payer: Self-pay | Admitting: Surgery

## 2018-09-06 DIAGNOSIS — G8929 Other chronic pain: Secondary | ICD-10-CM | POA: Diagnosis not present

## 2018-09-06 DIAGNOSIS — M1712 Unilateral primary osteoarthritis, left knee: Secondary | ICD-10-CM | POA: Diagnosis not present

## 2018-09-06 DIAGNOSIS — M25562 Pain in left knee: Principal | ICD-10-CM

## 2018-09-06 DIAGNOSIS — M1711 Unilateral primary osteoarthritis, right knee: Secondary | ICD-10-CM

## 2018-09-19 ENCOUNTER — Ambulatory Visit: Payer: PPO

## 2018-09-22 ENCOUNTER — Encounter (INDEPENDENT_AMBULATORY_CARE_PROVIDER_SITE_OTHER): Payer: Self-pay

## 2018-09-22 ENCOUNTER — Ambulatory Visit
Admission: RE | Admit: 2018-09-22 | Discharge: 2018-09-22 | Disposition: A | Discharge status: Home / Self Care | Payer: PPO | Source: Ambulatory Visit | Attending: Surgery | Admitting: Surgery

## 2018-09-22 DIAGNOSIS — M1711 Unilateral primary osteoarthritis, right knee: Secondary | ICD-10-CM | POA: Insufficient documentation

## 2018-09-22 DIAGNOSIS — G8929 Other chronic pain: Secondary | ICD-10-CM | POA: Insufficient documentation

## 2018-09-22 DIAGNOSIS — M25562 Pain in left knee: Secondary | ICD-10-CM | POA: Insufficient documentation

## 2018-09-27 DIAGNOSIS — M1712 Unilateral primary osteoarthritis, left knee: Secondary | ICD-10-CM | POA: Diagnosis not present

## 2018-10-17 DIAGNOSIS — Z0000 Encounter for general adult medical examination without abnormal findings: Secondary | ICD-10-CM | POA: Diagnosis not present

## 2018-10-17 DIAGNOSIS — G4700 Insomnia, unspecified: Secondary | ICD-10-CM | POA: Diagnosis not present

## 2018-10-17 DIAGNOSIS — Z23 Encounter for immunization: Secondary | ICD-10-CM | POA: Diagnosis not present

## 2018-10-17 DIAGNOSIS — M129 Arthropathy, unspecified: Secondary | ICD-10-CM | POA: Diagnosis not present

## 2018-10-17 DIAGNOSIS — F17200 Nicotine dependence, unspecified, uncomplicated: Secondary | ICD-10-CM | POA: Diagnosis not present

## 2018-10-17 DIAGNOSIS — G4733 Obstructive sleep apnea (adult) (pediatric): Secondary | ICD-10-CM | POA: Diagnosis not present

## 2018-10-17 DIAGNOSIS — I10 Essential (primary) hypertension: Secondary | ICD-10-CM | POA: Diagnosis not present

## 2018-10-17 DIAGNOSIS — Z9989 Dependence on other enabling machines and devices: Secondary | ICD-10-CM | POA: Diagnosis not present

## 2018-10-20 ENCOUNTER — Other Ambulatory Visit: Payer: PPO

## 2018-10-31 ENCOUNTER — Inpatient Hospital Stay: Admit: 2018-10-31 | Payer: PPO | Admitting: Surgery

## 2018-10-31 SURGERY — ARTHROPLASTY, KNEE, TOTAL
Anesthesia: Choice | Laterality: Left

## 2018-11-07 DIAGNOSIS — G4700 Insomnia, unspecified: Secondary | ICD-10-CM | POA: Diagnosis not present

## 2018-11-07 DIAGNOSIS — F17200 Nicotine dependence, unspecified, uncomplicated: Secondary | ICD-10-CM | POA: Diagnosis not present

## 2018-11-07 DIAGNOSIS — J301 Allergic rhinitis due to pollen: Secondary | ICD-10-CM | POA: Diagnosis not present

## 2018-11-07 DIAGNOSIS — I10 Essential (primary) hypertension: Secondary | ICD-10-CM | POA: Diagnosis not present

## 2018-12-28 ENCOUNTER — Encounter
Admission: RE | Admit: 2018-12-28 | Discharge: 2018-12-28 | Disposition: A | Discharge status: Home / Self Care | Payer: PPO | Source: Ambulatory Visit | Attending: Surgery | Admitting: Surgery

## 2018-12-28 ENCOUNTER — Other Ambulatory Visit: Payer: Self-pay

## 2018-12-28 DIAGNOSIS — I10 Essential (primary) hypertension: Secondary | ICD-10-CM | POA: Insufficient documentation

## 2018-12-28 DIAGNOSIS — Z01818 Encounter for other preprocedural examination: Secondary | ICD-10-CM | POA: Insufficient documentation

## 2018-12-28 HISTORY — DX: Dyspnea, unspecified: R06.00

## 2018-12-28 HISTORY — DX: Essential (primary) hypertension: I10

## 2018-12-28 HISTORY — DX: Sleep apnea, unspecified: G47.30

## 2018-12-28 LAB — URINALYSIS, COMPLETE (UACMP) WITH MICROSCOPIC
Bacteria, UA: NONE SEEN
Bilirubin Urine: NEGATIVE
Glucose, UA: NEGATIVE mg/dL
Ketones, ur: NEGATIVE mg/dL
Leukocytes,Ua: NEGATIVE
Nitrite: NEGATIVE
Protein, ur: NEGATIVE mg/dL
Specific Gravity, Urine: 1.01 (ref 1.005–1.030)
Squamous Epithelial / LPF: NONE SEEN (ref 0–5)
pH: 5 (ref 5.0–8.0)

## 2018-12-28 LAB — SURGICAL PCR SCREEN
MRSA, PCR: NEGATIVE
Staphylococcus aureus: POSITIVE — AB

## 2018-12-28 LAB — PROTIME-INR
INR: 1 (ref 0.8–1.2)
Prothrombin Time: 12.9 seconds (ref 11.4–15.2)

## 2018-12-28 LAB — CBC
HCT: 40.5 % (ref 36.0–46.0)
Hemoglobin: 13.2 g/dL (ref 12.0–15.0)
MCH: 30.8 pg (ref 26.0–34.0)
MCHC: 32.6 g/dL (ref 30.0–36.0)
MCV: 94.6 fL (ref 80.0–100.0)
Platelets: 333 10*3/uL (ref 150–400)
RBC: 4.28 MIL/uL (ref 3.87–5.11)
RDW: 13.2 % (ref 11.5–15.5)
WBC: 8.2 10*3/uL (ref 4.0–10.5)
nRBC: 0 % (ref 0.0–0.2)

## 2018-12-28 LAB — BASIC METABOLIC PANEL
Anion gap: 9 (ref 5–15)
BUN: 21 mg/dL (ref 8–23)
CO2: 26 mmol/L (ref 22–32)
Calcium: 9.1 mg/dL (ref 8.9–10.3)
Chloride: 103 mmol/L (ref 98–111)
Creatinine, Ser: 1.15 mg/dL — ABNORMAL HIGH (ref 0.44–1.00)
GFR calc Af Amer: 54 mL/min — ABNORMAL LOW (ref >60)
GFR calc non Af Amer: 47 mL/min — ABNORMAL LOW (ref >60)
Glucose, Bld: 123 mg/dL — ABNORMAL HIGH (ref 70–99)
Potassium: 3.8 mmol/L (ref 3.5–5.1)
Sodium: 138 mmol/L (ref 135–145)

## 2018-12-28 LAB — APTT: aPTT: 27 seconds (ref 24–36)

## 2018-12-28 NOTE — Patient Instructions (Signed)
Your procedure is scheduled on: 01/04/2019 Thurs Report to Same Day Surgery 2nd floor medical mall [address] [facility](Medical Mall Entrance-take elevator on left to 2nd floor.  Check in with surgery information desk.) To find out your arrival time please call [phone] between 1PM - 3PM on 01/03/2019 Wed  Remember: Instructions that are not followed completely may result in serious medical risk, up to and including death, or upon the discretion of your surgeon and anesthesiologist your surgery may need to be rescheduled.    _x___ 1. Do not eat food after midnight the night before your procedure. You may drink clear liquids up to 2 hours before you are scheduled to arrive at the hospital for your procedure.  Do not drink clear liquids within 2 hours of your scheduled arrival to the hospital.  Clear liquids include  --Water or Apple juice without pulp  --Clear carbohydrate beverage such as ClearFast or Gatorade  --Black Coffee or Clear Tea (No milk, no creamers, do not add anything to                  the coffee or Tea Type 1 and type 2 diabetics should only drink water.   ____Ensure clear carbohydrate drink on the way to the hospital for bariatric patients  ____Ensure clear carbohydrate drink 3 hours before surgery for Dr Rutherford NailByrnett's patients if physician instructed.   No gum chewing or hard candies.     __x__ 2. No Alcohol for 24 hours before or after surgery.   __x__3. No Smoking or e-cigarettes for 24 prior to surgery.  Do not use any chewable tobacco products for at least 6 hour prior to surgery   ____  4. Bring all medications with you on the day of surgery if instructed.    __x__ 5. Notify your doctor if there is any change in your medical condition     (cold, fever, infections).    x___6. On the morning of surgery brush your teeth with toothpaste and water.  You may rinse your mouth with mouth wash if you wish.  Do not swallow any toothpaste or mouthwash.   Do not wear jewelry, make-up, hairpins,  clips or nail polish.  Do not wear lotions, powders, or perfumes. You may wear deodorant.  Do not shave 48 hours prior to surgery. Men may shave face and neck.  Do not bring valuables to the hospital.    [facility] is not responsible for any belongings or valuables.               Contacts, dentures or bridgework may not be worn into surgery.  Leave your suitcase in the car. After surgery it may be brought to your room.  For patients admitted to the hospital, discharge time is determined by your                       treatment team.  _  Patients discharged the day of surgery will not be allowed to drive home.  You will need someone to drive you home and stay with you the night of your procedure.    Please read over the following fact sheets that you were given:   [facility] Preparing for [facility] and or MRSA Information   _x___ Take anti-hypertensive listed below, cardiac, seizure, asthma,     anti-reflux and psychiatric medicines. These include:  1. buPROPion (WELLBUTRIN XL) 150  2.ipratropium (ATROVENT) 0.06 %  3.  4.  5.  6.  ____Fleets  enema or Magnesium Citrate as directed.   _x___ Use CHG Soap or sage wipes as directed on instruction sheet   ____ Use inhalers on the day of surgery and bring to hospital day of surgery  ____ Stop Metformin and Janumet 2 days prior to surgery.    ____ Take 1/2 of usual insulin dose the night before surgery and none on the morning     surgery.   _x___ Follow recommendations from Cardiologist, Pulmonologist or PCP regarding          stopping Aspirin, Coumadin, Plavix ,Eliquis, Effient, or Pradaxa, and Pletal.  X____Stop Anti-inflammatories such as Advil, Aleve, Ibuprofen, Motrin, Naproxen, Naprosyn, Goodies powders or aspirin products. OK to take Tylenol and                          Celebrex.   _x___ Stop supplements until after surgery.  But may continue Vitamin D, Vitamin B,       and multivitamin.   _x___ Bring C-Pap to the hospital.

## 2018-12-28 NOTE — Pre-Procedure Instructions (Signed)
Positive Staph aureus results sent to Dr. Joice Lofts for review.

## 2018-12-29 LAB — URINE CULTURE: Culture: <10000 — AB

## 2019-01-01 ENCOUNTER — Other Ambulatory Visit: Payer: Self-pay

## 2019-01-01 ENCOUNTER — Other Ambulatory Visit
Admission: RE | Admit: 2019-01-01 | Discharge: 2019-01-01 | Disposition: A | Discharge status: Home / Self Care | Payer: PPO | Source: Ambulatory Visit | Attending: Surgery | Admitting: Surgery

## 2019-01-01 DIAGNOSIS — Z1159 Encounter for screening for other viral diseases: Secondary | ICD-10-CM | POA: Diagnosis not present

## 2019-01-02 LAB — NOVEL CORONAVIRUS, NAA (HOSP ORDER, SEND-OUT TO REF LAB; TAT 18-24 HRS): SARS-CoV-2, NAA: NOT DETECTED

## 2019-01-03 MED ORDER — VANCOMYCIN HCL IN DEXTROSE 1-5 GM/200ML-% IV SOLN
1000.0000 mg | Freq: Once | INTRAVENOUS | Status: AC
  Administered 2019-01-04: 07:00:00 1000 mg via INTRAVENOUS

## 2019-01-04 ENCOUNTER — Inpatient Hospital Stay: Payer: PPO | Admitting: Anesthesiology

## 2019-01-04 ENCOUNTER — Other Ambulatory Visit: Payer: Self-pay

## 2019-01-04 ENCOUNTER — Inpatient Hospital Stay
Admission: RE | Admit: 2019-01-04 | Discharge: 2019-01-04 | DRG: 470 | Disposition: A | Discharge status: Home / Self Care | Payer: PPO | Attending: Surgery | Admitting: Surgery

## 2019-01-04 ENCOUNTER — Encounter
Admission: RE | Disposition: A | Discharge status: Home / Self Care | Payer: Self-pay | Source: Home / Self Care | Attending: Surgery

## 2019-01-04 ENCOUNTER — Inpatient Hospital Stay: Payer: PPO

## 2019-01-04 ENCOUNTER — Encounter: Payer: Self-pay | Admitting: *Deleted

## 2019-01-04 DIAGNOSIS — Z91030 Bee allergy status: Secondary | ICD-10-CM | POA: Diagnosis not present

## 2019-01-04 DIAGNOSIS — F17210 Nicotine dependence, cigarettes, uncomplicated: Secondary | ICD-10-CM | POA: Diagnosis present

## 2019-01-04 DIAGNOSIS — I10 Essential (primary) hypertension: Secondary | ICD-10-CM | POA: Diagnosis present

## 2019-01-04 DIAGNOSIS — Z79899 Other long term (current) drug therapy: Secondary | ICD-10-CM | POA: Diagnosis not present

## 2019-01-04 DIAGNOSIS — Z471 Aftercare following joint replacement surgery: Secondary | ICD-10-CM | POA: Diagnosis not present

## 2019-01-04 DIAGNOSIS — M1712 Unilateral primary osteoarthritis, left knee: Secondary | ICD-10-CM | POA: Diagnosis not present

## 2019-01-04 DIAGNOSIS — Z96652 Presence of left artificial knee joint: Secondary | ICD-10-CM | POA: Diagnosis not present

## 2019-01-04 DIAGNOSIS — Z91013 Allergy to seafood: Secondary | ICD-10-CM

## 2019-01-04 DIAGNOSIS — Z91041 Radiographic dye allergy status: Secondary | ICD-10-CM | POA: Diagnosis not present

## 2019-01-04 DIAGNOSIS — G4730 Sleep apnea, unspecified: Secondary | ICD-10-CM | POA: Diagnosis not present

## 2019-01-04 DIAGNOSIS — M25762 Osteophyte, left knee: Secondary | ICD-10-CM | POA: Diagnosis present

## 2019-01-04 DIAGNOSIS — M25562 Pain in left knee: Secondary | ICD-10-CM | POA: Diagnosis present

## 2019-01-04 HISTORY — PX: PARTIAL KNEE ARTHROPLASTY: SHX2174

## 2019-01-04 LAB — TYPE AND SCREEN
ABO/RH(D): AB POS
Antibody Screen: NEGATIVE

## 2019-01-04 SURGERY — ARTHROPLASTY, KNEE, UNICOMPARTMENTAL
Anesthesia: General | Laterality: Left

## 2019-01-04 MED ORDER — FENTANYL CITRATE (PF) 100 MCG/2ML IJ SOLN
INTRAMUSCULAR | Status: AC
  Filled 2019-01-04: qty 2

## 2019-01-04 MED ORDER — SEVOFLURANE IN SOLN
RESPIRATORY_TRACT | Status: AC
  Filled 2019-01-04: qty 250

## 2019-01-04 MED ORDER — ASPIRIN EC 81 MG PO TBEC: 81.0000 mg | DELAYED_RELEASE_TABLET | Freq: Every day | ORAL | Status: DC

## 2019-01-04 MED ORDER — DOCUSATE SODIUM 100 MG PO CAPS: 100.0000 mg | ORAL_CAPSULE | Freq: Two times a day (BID) | ORAL | Status: DC

## 2019-01-04 MED ORDER — ACETAMINOPHEN 10 MG/ML IV SOLN: 1000.0000 mg | Freq: Once | INTRAVENOUS | Status: DC | PRN

## 2019-01-04 MED ORDER — TRAZODONE HCL 100 MG PO TABS: 100.0000 mg | ORAL_TABLET | Freq: Every evening | ORAL | Status: DC | PRN

## 2019-01-04 MED ORDER — HYDROMORPHONE HCL 1 MG/ML IJ SOLN
INTRAMUSCULAR | Status: AC
  Filled 2019-01-04: qty 1

## 2019-01-04 MED ORDER — METOCLOPRAMIDE HCL 5 MG/ML IJ SOLN: 5.0000 mg | Freq: Three times a day (TID) | INTRAMUSCULAR | Status: DC | PRN

## 2019-01-04 MED ORDER — ADULT MULTIVITAMIN W/MINERALS CH: 1.0000 | ORAL_TABLET | Freq: Every day | ORAL | Status: DC

## 2019-01-04 MED ORDER — ACETAMINOPHEN 500 MG PO TABS
1000.0000 mg | ORAL_TABLET | Freq: Four times a day (QID) | ORAL | Status: DC
  Administered 2019-01-04: 1000 mg via ORAL
  Filled 2019-01-04: qty 2

## 2019-01-04 MED ORDER — BISACODYL 10 MG RE SUPP: 10.0000 mg | Freq: Every day | RECTAL | Status: DC | PRN

## 2019-01-04 MED ORDER — FENTANYL CITRATE (PF) 100 MCG/2ML IJ SOLN: 25.0000 ug | INTRAMUSCULAR | Status: DC | PRN

## 2019-01-04 MED ORDER — FAMOTIDINE 20 MG PO TABS
20.0000 mg | ORAL_TABLET | Freq: Once | ORAL | Status: AC
  Administered 2019-01-04: 07:00:00 20 mg via ORAL

## 2019-01-04 MED ORDER — PROPOFOL 10 MG/ML IV BOLUS
INTRAVENOUS | Status: AC
  Filled 2019-01-04: qty 20

## 2019-01-04 MED ORDER — ACETAMINOPHEN 325 MG PO TABS: 325.0000 mg | ORAL_TABLET | ORAL | Status: DC | PRN

## 2019-01-04 MED ORDER — LACTATED RINGERS IV SOLN
INTRAVENOUS | Status: DC | PRN
  Administered 2019-01-04: 07:00:00 via INTRAVENOUS

## 2019-01-04 MED ORDER — MAGNESIUM HYDROXIDE 400 MG/5ML PO SUSP: 30.0000 mL | Freq: Every day | ORAL | Status: DC | PRN

## 2019-01-04 MED ORDER — FLEET ENEMA 7-19 GM/118ML RE ENEM: 1.0000 | ENEMA | Freq: Once | RECTAL | Status: DC | PRN

## 2019-01-04 MED ORDER — TRAMADOL HCL 50 MG PO TABS: 50.0000 mg | ORAL_TABLET | Freq: Four times a day (QID) | ORAL | Status: DC | PRN

## 2019-01-04 MED ORDER — LACTATED RINGERS IV SOLN
INTRAVENOUS | Status: DC
  Administered 2019-01-04 (×2): via INTRAVENOUS

## 2019-01-04 MED ORDER — MIDAZOLAM HCL 2 MG/2ML IJ SOLN
INTRAMUSCULAR | Status: AC
  Filled 2019-01-04: qty 2

## 2019-01-04 MED ORDER — FAMOTIDINE 20 MG PO TABS
ORAL_TABLET | ORAL | Status: AC
  Administered 2019-01-04: 20 mg via ORAL
  Filled 2019-01-04: qty 1

## 2019-01-04 MED ORDER — ROCURONIUM BROMIDE 100 MG/10ML IV SOLN
INTRAVENOUS | Status: DC | PRN
  Administered 2019-01-04: 50 mg via INTRAVENOUS

## 2019-01-04 MED ORDER — OXYCODONE HCL 5 MG PO TABS: 5.0000 mg | ORAL_TABLET | ORAL | 0 refills | Status: DC | PRN

## 2019-01-04 MED ORDER — ONDANSETRON HCL 4 MG/2ML IJ SOLN
INTRAMUSCULAR | Status: AC
  Filled 2019-01-04: qty 2

## 2019-01-04 MED ORDER — OXYCODONE HCL 5 MG PO TABS: 5.0000 mg | ORAL_TABLET | ORAL | Status: DC | PRN

## 2019-01-04 MED ORDER — CEFAZOLIN SODIUM-DEXTROSE 2-4 GM/100ML-% IV SOLN
2.0000 g | Freq: Four times a day (QID) | INTRAVENOUS | Status: DC
  Administered 2019-01-04: 2 g via INTRAVENOUS
  Filled 2019-01-04 (×3): qty 100

## 2019-01-04 MED ORDER — HYDROMORPHONE HCL 1 MG/ML IJ SOLN
INTRAMUSCULAR | Status: DC | PRN
  Administered 2019-01-04: .8 mg via INTRAVENOUS
  Administered 2019-01-04 (×2): .6 mg via INTRAVENOUS

## 2019-01-04 MED ORDER — VANCOMYCIN HCL IN DEXTROSE 1-5 GM/200ML-% IV SOLN
INTRAVENOUS | Status: AC
  Administered 2019-01-04: 1000 mg via INTRAVENOUS
  Filled 2019-01-04: qty 200

## 2019-01-04 MED ORDER — IPRATROPIUM BROMIDE 0.06 % NA SOLN
2.0000 | Freq: Every day | NASAL | Status: DC
  Filled 2019-01-04: qty 15

## 2019-01-04 MED ORDER — PHENYLEPHRINE HCL (PRESSORS) 10 MG/ML IV SOLN
INTRAVENOUS | Status: AC
  Filled 2019-01-04: qty 1

## 2019-01-04 MED ORDER — FENTANYL CITRATE (PF) 100 MCG/2ML IJ SOLN
INTRAMUSCULAR | Status: DC | PRN
  Administered 2019-01-04: 100 ug via INTRAVENOUS

## 2019-01-04 MED ORDER — ENOXAPARIN SODIUM 40 MG/0.4ML ~~LOC~~ SOLN: 40.0000 mg | Freq: Once | SUBCUTANEOUS | Status: DC

## 2019-01-04 MED ORDER — ENOXAPARIN SODIUM 40 MG/0.4ML ~~LOC~~ SOLN: 40.0000 mg | SUBCUTANEOUS | Status: DC

## 2019-01-04 MED ORDER — KETOROLAC TROMETHAMINE 15 MG/ML IJ SOLN: 7.5000 mg | Freq: Four times a day (QID) | INTRAMUSCULAR | Status: DC

## 2019-01-04 MED ORDER — PROPOFOL 10 MG/ML IV BOLUS
INTRAVENOUS | Status: DC | PRN
  Administered 2019-01-04: 150 mg via INTRAVENOUS

## 2019-01-04 MED ORDER — PROMETHAZINE HCL 25 MG/ML IJ SOLN: 6.2500 mg | INTRAMUSCULAR | Status: DC | PRN

## 2019-01-04 MED ORDER — DIPHENHYDRAMINE HCL 12.5 MG/5ML PO ELIX: 12.5000 mg | ORAL_SOLUTION | ORAL | Status: DC | PRN

## 2019-01-04 MED ORDER — ACETAMINOPHEN 10 MG/ML IV SOLN
INTRAVENOUS | Status: AC
  Filled 2019-01-04: qty 100

## 2019-01-04 MED ORDER — BUPROPION HCL ER (XL) 150 MG PO TB24: 150.0000 mg | ORAL_TABLET | Freq: Every day | ORAL | Status: DC

## 2019-01-04 MED ORDER — ACETAMINOPHEN 325 MG PO TABS: 325.0000 mg | ORAL_TABLET | Freq: Four times a day (QID) | ORAL | Status: DC | PRN

## 2019-01-04 MED ORDER — KETOROLAC TROMETHAMINE 15 MG/ML IJ SOLN
INTRAMUSCULAR | Status: DC | PRN
  Administered 2019-01-04: 15 mg via INTRAVENOUS

## 2019-01-04 MED ORDER — DEXAMETHASONE SODIUM PHOSPHATE 10 MG/ML IJ SOLN
INTRAMUSCULAR | Status: DC | PRN
  Administered 2019-01-04: 5 mg via INTRAVENOUS

## 2019-01-04 MED ORDER — HYDROMORPHONE HCL 1 MG/ML IJ SOLN: 0.2500 mg | INTRAMUSCULAR | Status: DC | PRN

## 2019-01-04 MED ORDER — PHENYLEPHRINE HCL (PRESSORS) 10 MG/ML IV SOLN
INTRAVENOUS | Status: DC | PRN
  Administered 2019-01-04 (×4): 100 ug via INTRAVENOUS

## 2019-01-04 MED ORDER — SUGAMMADEX SODIUM 500 MG/5ML IV SOLN
INTRAVENOUS | Status: DC | PRN
  Administered 2019-01-04: 200 mg via INTRAVENOUS

## 2019-01-04 MED ORDER — SODIUM CHLORIDE 0.9 % IV SOLN
INTRAVENOUS | Status: DC | PRN
  Administered 2019-01-04: 09:00:00 60 mL

## 2019-01-04 MED ORDER — LIDOCAINE HCL (CARDIAC) PF 100 MG/5ML IV SOSY
PREFILLED_SYRINGE | INTRAVENOUS | Status: DC | PRN
  Administered 2019-01-04: 100 mg via INTRAVENOUS

## 2019-01-04 MED ORDER — HYDROCODONE-ACETAMINOPHEN 7.5-325 MG PO TABS
1.0000 | ORAL_TABLET | Freq: Once | ORAL | Status: DC | PRN
  Filled 2019-01-04: qty 1

## 2019-01-04 MED ORDER — BUPIVACAINE-EPINEPHRINE (PF) 0.5% -1:200000 IJ SOLN
INTRAMUSCULAR | Status: DC | PRN
  Administered 2019-01-04: 30 mL

## 2019-01-04 MED ORDER — KETOROLAC TROMETHAMINE 15 MG/ML IJ SOLN
15.0000 mg | Freq: Once | INTRAMUSCULAR | Status: AC
  Administered 2019-01-04: 15 mg via INTRAVENOUS

## 2019-01-04 MED ORDER — ONDANSETRON HCL 4 MG PO TABS: 4.0000 mg | ORAL_TABLET | Freq: Four times a day (QID) | ORAL | Status: DC | PRN

## 2019-01-04 MED ORDER — ONDANSETRON HCL 4 MG/2ML IJ SOLN: 4.0000 mg | Freq: Four times a day (QID) | INTRAMUSCULAR | Status: DC | PRN

## 2019-01-04 MED ORDER — TRANEXAMIC ACID 1000 MG/10ML IV SOLN
INTRAVENOUS | Status: DC | PRN
  Administered 2019-01-04: 1000 mg via TOPICAL

## 2019-01-04 MED ORDER — SODIUM CHLORIDE 0.9 % IV SOLN
INTRAVENOUS | Status: DC
  Administered 2019-01-04: 14:00:00 via INTRAVENOUS

## 2019-01-04 MED ORDER — MEPERIDINE HCL 50 MG/ML IJ SOLN: 6.2500 mg | INTRAMUSCULAR | Status: DC | PRN

## 2019-01-04 MED ORDER — ACETAMINOPHEN 160 MG/5ML PO SOLN
325.0000 mg | ORAL | Status: DC | PRN
  Filled 2019-01-04: qty 20.3

## 2019-01-04 MED ORDER — LORATADINE 10 MG PO TABS: 10.0000 mg | ORAL_TABLET | Freq: Every day | ORAL | Status: DC

## 2019-01-04 MED ORDER — MIDAZOLAM HCL 2 MG/2ML IJ SOLN
INTRAMUSCULAR | Status: DC | PRN
  Administered 2019-01-04: 2 mg via INTRAVENOUS

## 2019-01-04 MED ORDER — APIXABAN 2.5 MG PO TABS: 2.5000 mg | ORAL_TABLET | Freq: Two times a day (BID) | ORAL | Status: DC

## 2019-01-04 MED ORDER — METOCLOPRAMIDE HCL 10 MG PO TABS: 5.0000 mg | ORAL_TABLET | Freq: Three times a day (TID) | ORAL | Status: DC | PRN

## 2019-01-04 MED ORDER — LIDOCAINE HCL (PF) 2 % IJ SOLN
INTRAMUSCULAR | Status: AC
  Filled 2019-01-04: qty 10

## 2019-01-04 MED ORDER — IRBESARTAN 75 MG PO TABS: 75.0000 mg | ORAL_TABLET | Freq: Every day | ORAL | Status: DC

## 2019-01-04 SURGICAL SUPPLY — 63 items
BANDAGE ACE 6X5 VEL STRL LF (GAUZE/BANDAGES/DRESSINGS) ×3 IMPLANT
BEARING MENISCAL TIBIAL 5 XS L (Orthopedic Implant) ×6 IMPLANT
CANISTER SUCT 1200ML W/VALVE (MISCELLANEOUS) ×3 IMPLANT
CANISTER SUCT 3000ML PPV (MISCELLANEOUS) ×3 IMPLANT
CEMENT BONE R 1X40 (Cement) ×3 IMPLANT
CEMENT VACUUM MIXING SYSTEM (MISCELLANEOUS) ×3 IMPLANT
CHLORAPREP W/TINT 26 (MISCELLANEOUS) ×6 IMPLANT
COOLER POLAR GLACIER W/PUMP (MISCELLANEOUS) ×3 IMPLANT
COVER MAYO STAND STRL (DRAPES) ×3 IMPLANT
COVER WAND RF STERILE (DRAPES) ×3 IMPLANT
CUFF TOURN SGL QUICK 24 (TOURNIQUET CUFF)
CUFF TOURN SGL QUICK 30 (TOURNIQUET CUFF) ×2
CUFF TRNQT CYL 24X4X16.5-23 (TOURNIQUET CUFF) IMPLANT
CUFF TRNQT CYL 30X4X21-28X (TOURNIQUET CUFF) ×1 IMPLANT
DRAPE C-ARM XRAY 36X54 (DRAPES) IMPLANT
DRSG OPSITE POSTOP 4X12 (GAUZE/BANDAGES/DRESSINGS) IMPLANT
DRSG OPSITE POSTOP 4X14 (GAUZE/BANDAGES/DRESSINGS) IMPLANT
DRSG OPSITE POSTOP 4X6 (GAUZE/BANDAGES/DRESSINGS) ×3 IMPLANT
ELECT CAUTERY BLADE 6.4 (BLADE) ×3 IMPLANT
ELECT REM PT RETURN 9FT ADLT (ELECTROSURGICAL) ×3
ELECTRODE REM PT RTRN 9FT ADLT (ELECTROSURGICAL) ×1 IMPLANT
GAUZE 4X4 16PLY RFD (DISPOSABLE) ×3 IMPLANT
GAUZE SPONGE 4X4 12PLY STRL (GAUZE/BANDAGES/DRESSINGS) ×3 IMPLANT
GAUZE XEROFORM 1X8 LF (GAUZE/BANDAGES/DRESSINGS) IMPLANT
GLOVE BIO SURGEON STRL SZ7.5 (GLOVE) ×12 IMPLANT
GLOVE BIO SURGEON STRL SZ8 (GLOVE) ×12 IMPLANT
GLOVE BIOGEL PI IND STRL 8 (GLOVE) ×1 IMPLANT
GLOVE BIOGEL PI INDICATOR 8 (GLOVE) ×2
GLOVE INDICATOR 8.0 STRL GRN (GLOVE) ×3 IMPLANT
GOWN STRL REUS W/ TWL LRG LVL3 (GOWN DISPOSABLE) ×1 IMPLANT
GOWN STRL REUS W/ TWL XL LVL3 (GOWN DISPOSABLE) ×1 IMPLANT
GOWN STRL REUS W/TWL LRG LVL3 (GOWN DISPOSABLE) ×2
GOWN STRL REUS W/TWL XL LVL3 (GOWN DISPOSABLE) ×2
HOLDER FOLEY CATH W/STRAP (MISCELLANEOUS) IMPLANT
HOOD PEEL AWAY FLYTE STAYCOOL (MISCELLANEOUS) ×9 IMPLANT
KIT TURNOVER KIT A (KITS) ×3 IMPLANT
KNEE PARTIAL CEMENT FEM XS (Miscellaneous) ×3 IMPLANT
MAT ABSORB  FLUID 56X50 GRAY (MISCELLANEOUS) ×2
MAT ABSORB FLUID 56X50 GRAY (MISCELLANEOUS) ×1 IMPLANT
NDL SAFETY ECLIPSE 18X1.5 (NEEDLE) ×1 IMPLANT
NEEDLE HYPO 18GX1.5 SHARP (NEEDLE) ×2
NEEDLE SPNL 20GX3.5 QUINCKE YW (NEEDLE) ×3 IMPLANT
NS IRRIG 1000ML POUR BTL (IV SOLUTION) ×3 IMPLANT
PACK BLADE SAW RECIP 70 3 PT (BLADE) ×3 IMPLANT
PACK TOTAL KNEE (MISCELLANEOUS) ×3 IMPLANT
PAD WRAPON POLAR KNEE (MISCELLANEOUS) ×1 IMPLANT
PULSAVAC PLUS IRRIG FAN TIP (DISPOSABLE) ×3
SOL .9 NS 3000ML IRR  AL (IV SOLUTION) ×2
SOL .9 NS 3000ML IRR UROMATIC (IV SOLUTION) ×1 IMPLANT
STAPLER SKIN PROX 35W (STAPLE) ×3 IMPLANT
STRAP SAFETY 5IN WIDE (MISCELLANEOUS) ×3 IMPLANT
SUCTION FRAZIER HANDLE 10FR (MISCELLANEOUS)
SUCTION TUBE FRAZIER 10FR DISP (MISCELLANEOUS) IMPLANT
SUT VIC AB 0 CT1 36 (SUTURE) ×6 IMPLANT
SUT VIC AB 2-0 CT1 27 (SUTURE) ×8
SUT VIC AB 2-0 CT1 TAPERPNT 27 (SUTURE) ×4 IMPLANT
SYR 10ML LL (SYRINGE) ×3 IMPLANT
SYR 20CC LL (SYRINGE) ×3 IMPLANT
SYR 30ML LL (SYRINGE) ×9 IMPLANT
TAPE TRANSPORE STRL 2 31045 (GAUZE/BANDAGES/DRESSINGS) ×3 IMPLANT
TIP FAN IRRIG PULSAVAC PLUS (DISPOSABLE) ×1 IMPLANT
TRAY FOLEY MTR SLVR 16FR STAT (SET/KITS/TRAYS/PACK) IMPLANT
WRAPON POLAR PAD KNEE (MISCELLANEOUS) ×3

## 2019-01-04 NOTE — Anesthesia Preprocedure Evaluation (Addendum)
Anesthesia Evaluation  Patient identified by MRN, date of birth, ID band Patient awake    Reviewed: Allergy & Precautions, H&P , NPO status , reviewed documented beta blocker date and time   Airway Mallampati: II  TM Distance: >3 FB Neck ROM: full    Dental  (+) Poor Dentition, Chipped, Missing   Pulmonary shortness of breath, sleep apnea and Continuous Positive Airway Pressure Ventilation , Current Smoker,    Pulmonary exam normal        Cardiovascular hypertension, Normal cardiovascular exam     Neuro/Psych    GI/Hepatic neg GERD  ,  Endo/Other    Renal/GU      Musculoskeletal  (+) Arthritis ,   Abdominal   Peds  Hematology   Anesthesia Other Findings Past Medical History: No date: Arthritis No date: Dyspnea No date: Hypertension No date: Sleep apnea Past Surgical History: No date: ABDOMINAL HYSTERECTOMY No date: CHOLECYSTECTOMY 2007: KNEE ARTHROSCOPY; Right  Pt refuses spinal, requests GA  Reproductive/Obstetrics                            Anesthesia Physical Anesthesia Plan  ASA: II  Anesthesia Plan: General   Post-op Pain Management:  Regional for Post-op pain   Induction: Intravenous  PONV Risk Score and Plan: 3 and Midazolam, Dexamethasone and Ondansetron  Airway Management Planned: Oral ETT and LMA  Additional Equipment:   Intra-op Plan:   Post-operative Plan: Extubation in OR  Informed Consent: I have reviewed the patients History and Physical, chart, labs and discussed the procedure including the risks, benefits and alternatives for the proposed anesthesia with the patient or authorized representative who has indicated his/her understanding and acceptance.     Dental Advisory Given  Plan Discussed with: CRNA  Anesthesia Plan Comments:         Anesthesia Quick Evaluation

## 2019-01-04 NOTE — Transfer of Care (Signed)
Immediate Anesthesia Transfer of Care Note  Patient: Kaitlyn Sanders  Procedure(s) Performed: UNICOMPARTMENTAL KNEE LEFT (Left )  Patient Location: PACU  Anesthesia Type:General  Level of Consciousness: sedated  Airway & Oxygen Therapy: Patient Spontanous Breathing and Patient connected to face mask oxygen  Post-op Assessment: Report given to RN and Post -op Vital signs reviewed and stable  Post vital signs: Reviewed and stable  Last Vitals:  Vitals Value Taken Time  BP 117/47 01/04/2019  9:50 AM  Temp 36.4 C 01/04/2019  9:50 AM  Pulse 81 01/04/2019  9:50 AM  Resp 16 01/04/2019  9:50 AM  SpO2 98 % 01/04/2019  9:50 AM    Last Pain:  Vitals:   01/04/19 0950  TempSrc:   PainSc: Asleep         Complications: No apparent anesthesia complications

## 2019-01-04 NOTE — Anesthesia Post-op Follow-up Note (Signed)
Anesthesia QCDR form completed.        

## 2019-01-04 NOTE — Op Note (Signed)
01/04/2019  9:32 AM  Patient:   Kaitlyn Sanders  Pre-Op Diagnosis:   Osteoarthritis of medial compartment, left knee.  Post-Op Diagnosis:   Same  Procedure:   Left unicondylar knee arthroplasty.  Surgeon:   Maryagnes Amos, MD  Assistant:   Horris Latino, PA-C  Anesthesia:   GET  Findings:   As above.  Complications:   None  EBL:   5 cc  Fluids:   1000 cc crystalloid  UOP:   None  TT:   75 minutes at 300 mmHg  Drains:   None  Closure:   Staples  Implants:   All-cemented Biomet Oxford system with an XS femoral component, a "C" sized tibial tray, and a 5 mm meniscal bearing insert.  Brief Clinical Note:   The patient is a 73 year old female with a long history of gradually worsening medial sided left knee pain. Her symptoms have progressed despite medications, activity modification, injections, etc. Her history examination consistent with degenerative joint disease confirmed by plain radiographs. An MRI scan also confirmed the significant arthritis confined to the medial compartment and verify that the lateral compartment was in satisfactory condition. The patient presents at this time for a left partial knee replacement.  Procedure:   The patient was brought into the operating room and a spinal placed by the anesthesiologist. The patient was lain in the supine position and a Foley catheter inserted. The patient was repositioned so that the non-surgical leg was placed in a flexed and abducted position in the yellow fin leg holder while the surgical extremity was placed over the Biomet leg holder. The left lower extremity was prepped with ChloraPrep solution before being draped sterilely. Preoperative antibiotics were administered. After performing a timeout to verify the appropriate surgical site, the limb was exsanguinated with an Esmarch and the tourniquet inflated to 300 mmHg. A standard anterior approach to the knee was made through an approximately 3.5-4 inch incision. The  incision was carried down through the subcutaneous tissues to expose the superficial retinaculum. This was split the length the incision and medial flap elevated sufficiently to expose the medial retinaculum. This was incised along the medial border of the patella tendon and extended proximally along the medial border of the patella, leaving a 3-4 mm cuff of tissue. The soft tissues were elevated off the anteromedial aspect of the proximal tibia. The anterior portion of the meniscus was removed after performing a subtotal excision of the infrapatellar fat pad. The anterior cruciate ligament was inspected and found to be in excellent condition. Osteophytes were removed from the inferior pole of the patella as well as from the notch using a quarter-inch osteotome. There were significant degenerative changes of both the femur and tibia on the medial side. The medial femoral condyle was sized using the small and medium sizers. It was felt that the XS guide best optimized the contour of the femur. This was left in place and the external tibial guide positioned. The coupling device was used to connect the guide to the medial femoral condylar sizer to optimize appropriate orientation. Two guide pins were inserted into the cutting block before the coupling device and sizer were removed. The appropriate tibial cut was made using the oscillating and reciprocating saws. The piece was removed in its entirety and taken to the back table where it was sized and found to be optimally replicated by a "C" sized component. The 9 mm spacer was inserted to verify that sufficient bone had been removed.  Attention  was directed to femoral side. The intramedullary canal was accessed through a 4 mm drill hole. The intramedullary guide was positioned before the guide for the femoral condylar holes was positioned. The appropriate coupling device connected this guide to the intramedullary guide before both drill holes were created in the  distal aspect of the medial femoral condyle. The devices were removed and the posterior condylar cutting block inserted. The appropriate cut was made using the reciprocating saw and this piece removed. The #0 spigot was inserted and the initial bone milling performed. A trial femoral component was inserted and both the flexion and extension gaps measured. In flexion, the gap measured 8 mm whereas in extension, it measured 5 mm. Therefore, the #3 spigot was selected and the secondary bone milling performed. Repeat sizing demonstrated symmetric flexion and extension gaps. The bone was removed from the postero-medial and postero-lateral aspects of the femoral condyle, as well as from the beneath the collar of the spigot. Bone also was removed from the anterior portion of the femur so as to minimize any potential impingement with the meniscal bearing insert. The trial components removed and several drill holes placed into the distal femoral condyle to further augment cement fixation.  Attention was redirected to the tibial side. The "C" sized tibial tray was positioned and temporarily secured using the appropriate spiked nail. The keel was created using the bi-bladed reciprocating saw and hoe. The keeled "C" sized trial tibial tray was inserted to be sure that it seated properly. At this point, a total of 20 cc of Exparel diluted out to 60 cc with normal saline and 30 cc of 0.5% Sensorcaine was injected in and around the posterior and medial capsular tissues, as well as the peri-incisional tissues to help with postoperative pain control.  The bony surfaces were prepared for cementing by irrigating them thoroughly with bacitracin saline solution using the jet lavage system before packing them with a dry Ray-Tec sponge. Meanwhile, cement was being mixed on the back table. When the cement was ready, the tibial tray was cemented in first. The excess cement was removed using a Public house managerWoodson elevator after impacting it into  place. Next, the femoral component was impacted into place. Again the excess cement was removed using a Public house managerWoodson elevator. The 5 mm spacer was inserted and the knee brought into near full extension while the cement hardened. Once the cement hardened, the spacer was removed and the 5 mm meniscal bearing insert was trialed. This demonstrated excellent tracking while the knee was placed through a range of motion, and showed no evidence towards subluxation or dislocation. In addition, it did not fit too tightly. Therefore, the permanent 5 mm meniscal bearing insert was snapped into position after verifying that no cement had been retained posteriorly. Again the knee was placed through a range of motion with the findings as described above.  The wound was copiously irrigated with bacitracin saline solution via the jet lavage system before the retinacular layer was reapproximated using #0 Vicryl interrupted sutures. At this point, 1 g of transexemic acid in 10 cc of normal saline was injected intra-articularly. The subcutaneous tissues were closed in two layers using 2-0 Vicryl interrupted sutures before the skin was closed using staples. A sterile occlusive dressing was applied to the knee before the patient was awakened. The patient was transferred back to his/her hospital bed and returned to the recovery room in satisfactory condition after tolerating the procedure well. A Polar Care device was applied to the knee as  well.

## 2019-01-04 NOTE — Discharge Instructions (Signed)
Diet: As you were doing prior to hospitalization   Shower:  May shower but keep the wounds dry, use an occlusive plastic wrap, NO SOAKING IN TUB.  If the bandage gets wet, change with a clean dry gauze.  Dressing:  You may change your dressing as needed. Change the dressing with sterile gauze dressing.    Activity:  Increase activity slowly as tolerated, but follow the weight bearing instructions below.  No lifting or driving for 6 weeks.  Weight Bearing:   Weight bearing as tolerated to left leg.  Blood Clot Prevention: Begin taking Eliquis 2.5mg  twice daily beginning on 01/05/19.  To prevent constipation: you may use a stool softener such as -  Colace (over the counter) 100 mg by mouth twice a day  Drink plenty of fluids (prune juice may be helpful) and high fiber foods Miralax (over the counter) for constipation as needed.    Itching:  If you experience itching with your medications, try taking only a single pain pill, or even half a pain pill at a time.  You may take up to 10 pain pills per day, and you can also use benadryl over the counter for itching or also to help with sleep.   Precautions:  If you experience chest pain or shortness of breath - call 911 immediately for transfer to the hospital emergency department!!  If you develop a fever greater that 101 F, purulent drainage from wound, increased redness or drainage from wound, or calf pain-Call [facility]                                              Follow- Up Appointment:  Please call for an appointment to be seen in 2 weeks at [facility]

## 2019-01-04 NOTE — H&P (Signed)
Paper H&P to be scanned into permanent record. H&P reviewed and patient re-examined. No changes. 

## 2019-01-04 NOTE — Anesthesia Procedure Notes (Signed)
Procedure Name: Intubation Date/Time: 01/04/2019 7:43 AM Performed by: Justus Memory, CRNA Pre-anesthesia Checklist: Patient identified, Patient being monitored, Timeout performed, Emergency Drugs available and Suction available Patient Re-evaluated:Patient Re-evaluated prior to induction Oxygen Delivery Method: Circle system utilized Preoxygenation: Pre-oxygenation with 100% oxygen Induction Type: IV induction Ventilation: Mask ventilation without difficulty Laryngoscope Size: Mac and 3 Grade View: Grade I Tube type: Oral Tube size: 7.0 mm Number of attempts: 1 Airway Equipment and Method: Stylet Placement Confirmation: ETT inserted through vocal cords under direct vision,  positive ETCO2 and breath sounds checked- equal and bilateral Secured at: 21 cm Tube secured with: Tape Dental Injury: Teeth and Oropharynx as per pre-operative assessment

## 2019-01-04 NOTE — Discharge Summary (Addendum)
Physician Discharge Summary  Patient ID: Kaitlyn SellSandra L Hegarty MRN: [id number] DOB/AGE: 12-02-1945 73 y.o.  Admit date: 01/04/2019 Discharge date: 01/04/2019  Admission Diagnoses:  Primary osteoarthritis of left knee  Discharge Diagnoses: Patient Active Problem List   Diagnosis Date Noted  . Status post left partial knee replacement 01/04/2019   Past Medical History:  Diagnosis Date  . Arthritis   . Dyspnea   . Hypertension   . Sleep apnea    Transfusion: None.   Consultants (if any):   Discharged Condition: Improved  Hospital Course: Kaitlyn SellSandra L Olenick is an 73 y.o. female who was admitted 01/04/2019 with a diagnosis of osteoarthritis of the medial compartment of the left knee and went to the operating room on 01/04/2019 and underwent the above named procedures.    Surgeries: Procedure(s): UNICOMPARTMENTAL KNEE LEFT on 01/04/2019 Patient tolerated the surgery well. Taken to PACU where she was stabilized and then transferred to the orthopedic floor.  Transitioned to Eliquis 2.5 twice daily following discharge from the hospital. Foot pumps applied bilaterally at 80 mm. Heels elevated on bed with rolled towels. No evidence of DVT. Negative Homan. Physical therapy started following surgery and she was able to do two laps around the nursing station and climbed stairs.  Patient's IV was removed on POD0.  Implants: All-cemented Biomet Oxford system with an XS femoral component, a "C" sized tibial tray, and a 5 mm meniscal bearing insert.  She was given perioperative antibiotics:  Anti-infectives (From admission, onward)   Start     Dose/Rate Route Frequency Ordered Stop   01/04/19 1400  ceFAZolin (ANCEF) IVPB 2g/100 mL premix     2 g 200 mL/hr over 30 Minutes Intravenous Every 6 hours 01/04/19 1135 01/05/19 0759   01/04/19 0000  vancomycin (VANCOCIN) IVPB 1000 mg/200 mL premix     1,000 mg 200 mL/hr over 60 Minutes Intravenous  Once 01/03/19 2231 01/04/19 0751    .  She was given  sequential compression devices, early ambulation for DVT prophylaxis.  She benefited maximally from the hospital stay and there were no complications.    Recent vital signs:  Vitals:   01/04/19 1335 01/04/19 1508  BP: 133/71 (!) 144/67  Pulse: 78 74  Resp: 18 17  Temp: 97.7 F (36.5 C) (!) 97.5 F (36.4 C)  SpO2: 100% 93%   Recent laboratory studies:  Lab Results  Component Value Date   HGB 13.2 12/28/2018   Lab Results  Component Value Date   WBC 8.2 12/28/2018   PLT 333 12/28/2018   Lab Results  Component Value Date   INR 1.0 12/28/2018   Lab Results  Component Value Date   NA 138 12/28/2018   K 3.8 12/28/2018   CL 103 12/28/2018   CO2 26 12/28/2018   BUN 21 12/28/2018   CREATININE 1.15 (H) 12/28/2018   GLUCOSE 123 (H) 12/28/2018   Discharge Medications:   Allergies as of 01/04/2019      Reactions   Bee Venom Anaphylaxis   Shrimp [shellfish Allergy] Itching, Swelling, Other (See Comments)   Throat closes   Contrast Media [iodinated Diagnostic Agents]       Medication List    STOP taking these medications   aspirin EC 81 MG tablet     TAKE these medications   apixaban 2.5 MG Tabs tablet Commonly known as:  Eliquis Take 1 tablet (2.5 mg total) by mouth 2 (two) times daily. Start taking on:  January 05, 2019   benzonatate 100 MG capsule  Commonly known as:  TESSALON Take 1 capsule (100 mg total) by mouth 3 (three) times daily as needed.   buPROPion 150 MG 24 hr tablet Commonly known as:  WELLBUTRIN XL Take 150 mg by mouth daily.   ipratropium 0.06 % nasal spray Commonly known as:  ATROVENT Place 2 sprays into both nostrils 4 (four) times daily as needed for rhinitis. What changed:    when to take this  additional instructions   loratadine 10 MG tablet Commonly known as:  CLARITIN Take 10 mg by mouth daily. Liquigel   multivitamin with minerals Tabs tablet Take 1 tablet by mouth daily. Centrum Silver   OVER THE COUNTER MEDICATION Apply 1  application topically daily as needed (pain). CBD Salve   oxyCODONE 5 MG immediate release tablet Commonly known as:  Oxy IR/ROXICODONE Take 1-2 tablets (5-10 mg total) by mouth every 4 (four) hours as needed for moderate pain.   traZODone 100 MG tablet Commonly known as:  DESYREL Take 1 tablet (100 mg total) by mouth at bedtime as needed for sleep. What changed:  when to take this   valsartan 80 MG tablet Commonly known as:  DIOVAN Take 80 mg by mouth daily.            Durable Medical Equipment  (From admission, onward)         Start     Ordered   01/04/19 1136  DME Bedside commode  Once    Question:  Patient needs a bedside commode to treat with the following condition  Answer:  Status post left partial knee replacement   01/04/19 1135   01/04/19 1136  DME 3 n 1  Once     01/04/19 1135   01/04/19 1136  DME Walker rolling  Once    Question:  Patient needs a walker to treat with the following condition  Answer:  Status post left partial knee replacement   01/04/19 1135         Diagnostic Studies: Dg Knee Left Port  Result Date: 01/04/2019 CLINICAL DATA:  Left knee arthroplasty. EXAM: PORTABLE LEFT KNEE - 1-2 VIEW COMPARISON:  09/22/2018. FINDINGS: Prior medial hemiarthroplasty. Hardware intact. Anatomic alignment. No acute bony abnormality. IMPRESSION: Prior medial hemiarthroplasty. Hardware intact. Anatomic alignment. Electronically Signed   By: Maisie Fus  Register   On: 01/04/2019 11:19   Disposition: Discharge disposition: 01-Home or Self Care     Discharge home later this afternoon.  Discharged the patient on Eliquis 2.5 mg twice daily for 14 days.  Follow-up Information    Dedra Skeens, PA-C Follow up in 14 day(s).   Specialty:  Orthopedic Surgery Why:  Staple Removal. Contact information: [address] [place] [address] [phone]          Signed: Meriel Pica PA-C 01/04/2019, 5:04 PM

## 2019-01-04 NOTE — Progress Notes (Addendum)
  Subjective: Day of Surgery Procedure(s) (LRB): UNICOMPARTMENTAL KNEE LEFT (Left) Patient reports pain as mild.   Patient is doing well and is wanting to discharge home this afternoon. Plan is to go Home after hospital stay. Negative for chest pain and shortness of breath Fever: no Gastrointestinal:Negative for nausea and vomiting  Objective: Vital signs in last 24 hours: Temp:  [96.1 F (35.6 C)-98.4 F (36.9 C)] 97.5 F (36.4 C) (06/04 1508) Pulse Rate:  [62-87] 74 (06/04 1508) Resp:  [16-21] 17 (06/04 1508) BP: (113-144)/(47-77) 144/67 (06/04 1508) SpO2:  [93 %-100 %] 93 % (06/04 1508)  Intake/Output from previous day:  Intake/Output Summary (Last 24 hours) at 01/04/2019 1653 Last data filed at 01/04/2019 1425 Gross per 24 hour  Intake 1800 ml  Output 560 ml  Net 1240 ml    Intake/Output this shift: Total I/O In: 1800 [P.O.:700; I.V.:1100] Out: 560 [Urine:550; Blood:10]  Labs: No results for input(s): HGB in the last 72 hours. No results for input(s): WBC, RBC, HCT, PLT in the last 72 hours. No results for input(s): NA, K, CL, CO2, BUN, CREATININE, GLUCOSE, CALCIUM in the last 72 hours. No results for input(s): LABPT, INR in the last 72 hours.   EXAM General - Patient is Alert, Appropriate and Oriented Extremity - ABD soft Sensation intact distally Intact pulses distally Dorsiflexion/Plantar flexion intact Incision: dressing C/D/I No cellulitis present  Negative Homan's sign Dressing/Incision - clean, dry, no drainage Motor Function - intact, moving foot and toes well on exam.  Abdomen is soft with normal BS.  Past Medical History:  Diagnosis Date  . Arthritis   . Dyspnea   . Hypertension   . Sleep apnea     Assessment/Plan: Day of Surgery Procedure(s) (LRB): UNICOMPARTMENTAL KNEE LEFT (Left) Active Problems:   Status post left partial knee replacement  Estimated body mass index is 32.22 kg/m as calculated from the following:   Height as of  12/28/18: 5' 2.5" (1.588 m).   Weight as of 12/28/18: 81.2 kg. Advance diet   Patient has cleared all steps with PT. Will plan for discharge home this afternoon. Will discharge the patient on Eliquis for DVT prevention. Follow-up in 10-14 days with [facility] for staple removal.  DVT Prophylaxis - Lovenox, Foot Pumps and TED hose Weight-Bearing as tolerated to left leg  J. Horris Latino, PA-C [facility] 01/04/2019, 4:53 PM

## 2019-01-04 NOTE — Progress Notes (Signed)
Physical therapy notified this nurse patient needs walker, (case manager gone for the evening). Micah Noel, [place] notified of the above. Per Micah Noel pt can discharge home today, he will order walker and pt can pick up tomm. Pt states she has old walker at home.

## 2019-01-04 NOTE — Progress Notes (Signed)
DISCHARGE NOTE:  Pt given discharge instructions. Pt verbalized understanding. Extra honeycomb dressing sent with pt. Pt wheeled to car by staff.

## 2019-01-04 NOTE — Evaluation (Signed)
Physical Therapy Evaluation Patient Details Name: Kaitlyn Sanders MRN: [id number] DOB: 06-Sep-1945 Today's Date: 01/04/2019   History of Present Illness  Pt is a 73 yo female diagnosed with osteoarthritis of medial compartment of the left knee and is s/p elective Left unicondylar knee arthroplasty.  PMH includes: sleep apnea on CPAP, HTN, dyspnea, and cholecystectomy.      Clinical Impression  Pt presents with min deficits in strength, transfers, gait, balance, L knee ROM, and activity tolerance but overall performed very well during the session.  Pt was Ind with bed mobility tasks and SBA with transfers.  Pt was able to amb 200' with slow but steady cadence with step-through pattern.  Pt was steady ascending and descending steps with cues for proper sequencing.  Pt will benefit from HHPT services upon discharge to safely address above deficits for decreased caregiver assistance and eventual return to PLOF.       Follow Up Recommendations Home health PT    Equipment Recommendations  Rolling walker with 5" wheels    Recommendations for Other Services       Precautions / Restrictions Precautions Precautions: Fall Restrictions Weight Bearing Restrictions: Yes LLE Weight Bearing: Weight bearing as tolerated      Mobility  Bed Mobility Overal bed mobility: Independent                Transfers Overall transfer level: Needs assistance Equipment used: Rolling walker (2 wheeled) Transfers: Sit to/from Stand Sit to Stand: Supervision         General transfer comment: Good eccentric and concentric control   Ambulation/Gait Ambulation/Gait assistance: Supervision Gait Distance (Feet): 200 Feet Assistive device: Rolling walker (2 wheeled) Gait Pattern/deviations: Step-through pattern;Decreased step length - right;Decreased step length - left Gait velocity: decreased   General Gait Details: Slow cadence but steady without LOB.  Min-mod verbal cues and demonstration to  prevent CKC twisting on the L knee during left turns.   Stairs Stairs: Yes Stairs assistance: Min guard Stair Management: Two rails Number of Stairs: 2 General stair comments: Ascend/descend two steps x 2 with B rails and min verbal and visual cues for sequencing  Wheelchair Mobility    Modified Rankin (Stroke Patients Only)       Balance Overall balance assessment: Mild deficits observed, not formally tested                                           Pertinent Vitals/Pain Pain Assessment: No/denies pain    Home Living Family/patient expects to be discharged to:: Private residence Living Arrangements: Alone Available Help at Discharge: Family;Available 24 hours/day;Other (Comment)(dtr to stay with pt as needed) Type of Home: House Home Access: Level entry     Home Layout: One level Home Equipment: Shower seat - built in;Grab bars - toilet;Toilet riser Additional Comments: Home set up to ADA standards    Prior Function Level of Independence: Independent         Comments: Ind Amb without an AD community distances, no fall history, Ind with ADLs     Hand Dominance        Extremity/Trunk Assessment   Upper Extremity Assessment Upper Extremity Assessment: Overall WFL for tasks assessed    Lower Extremity Assessment Lower Extremity Assessment: Generalized weakness       Communication   Communication: No difficulties  Cognition Arousal/Alertness: Awake/alert Behavior During Therapy: WFL for tasks  assessed/performed Overall Cognitive Status: Within Functional Limits for tasks assessed                                        General Comments      Exercises Total Joint Exercises Ankle Circles/Pumps: AROM;Strengthening;Both;10 reps;5 reps Quad Sets: AROM;Strengthening;Both;10 reps;15 reps Gluteal Sets: Strengthening;Both;10 reps Short Arc Quad: AROM;Strengthening;Both;10 reps Hip ABduction/ADduction: AROM;Both;10  reps Straight Leg Raises: AROM;Both;10 reps Long Arc Quad: AROM;Strengthening;Both;10 reps;15 reps Knee Flexion: AROM;Both;Strengthening;10 reps;15 reps Goniometric ROM: L knee AROM: 6-110 deg Other Exercises Other Exercises: HEP education and review per handout Other Exercises: L knee positioning education and review Other Exercises: Discussion on proper sequencing during car transfers Other Exercises: 90 deg Left turn training to prevent CKC twisting on the L knee    Assessment/Plan    PT Assessment Patient needs continued PT services  PT Problem List Decreased strength;Decreased range of motion       PT Treatment Interventions DME instruction;Gait training;Stair training;Functional mobility training;Therapeutic activities;Therapeutic exercise;Balance training;Patient/family education    PT Goals (Current goals can be found in the Care Plan section)  Acute Rehab PT Goals Patient Stated Goal: To return home and get back to volunteering PT Goal Formulation: With patient Time For Goal Achievement: 01/17/19 Potential to Achieve Goals: Good    Frequency BID   Barriers to discharge        Co-evaluation               AM-PAC PT "6 Clicks" Mobility  Outcome Measure Help needed turning from your back to your side while in a flat bed without using bedrails?: None Help needed moving from lying on your back to sitting on the side of a flat bed without using bedrails?: None Help needed moving to and from a bed to a chair (including a wheelchair)?: A Little Help needed standing up from a chair using your arms (e.g., wheelchair or bedside chair)?: A Little Help needed to walk in hospital room?: A Little Help needed climbing 3-5 steps with a railing? : A Little 6 Click Score: 20    End of Session Equipment Utilized During Treatment: Gait belt Activity Tolerance: Patient tolerated treatment well Patient left: in chair;with chair alarm set;with call bell/phone within reach;with  SCD's reapplied;Other (comment)(Polar care donned) Nurse Communication: Mobility status PT Visit Diagnosis: Muscle weakness (generalized) (M62.81);Other abnormalities of gait and mobility (R26.89)    Time: [id number] PT Time Calculation (min) (ACUTE ONLY): 55 min   Charges:   PT Evaluation $PT Eval Low Complexity: 1 Low PT Treatments $Gait Training: 8-22 mins $Therapeutic Exercise: 8-22 mins $Therapeutic Activity: 8-22 mins        D. Elly ModenaScott Marwa Fuhrman PT, DPT 01/04/19, 5:30 PM

## 2019-01-05 NOTE — TOC Progression Note (Signed)
Transition of Care [facility]) - Progression Note    Patient Details  Name: Kaitlyn Sanders MRN: [id number] Date of Birth: 1946/03/20  [facility]) CM/SW Contact  Barrie Dunker, RN Phone Number: 01/05/2019, 9:44 AM  Clinical Narrative:     Notified Brad with [employer] that the order for a RW is in the chart and that the Daughter will pick up today when ready to call her at [phone]       Expected Discharge Plan and Services           Expected Discharge Date: 01/04/19                                     Social Determinants of Health (SDOH) Interventions    Readmission Risk Interventions No flowsheet data found.
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
## 2019-01-06 DIAGNOSIS — Z96652 Presence of left artificial knee joint: Secondary | ICD-10-CM | POA: Diagnosis not present

## 2019-01-06 DIAGNOSIS — I10 Essential (primary) hypertension: Secondary | ICD-10-CM | POA: Diagnosis not present

## 2019-01-06 DIAGNOSIS — Z7901 Long term (current) use of anticoagulants: Secondary | ICD-10-CM | POA: Diagnosis not present

## 2019-01-06 DIAGNOSIS — G4730 Sleep apnea, unspecified: Secondary | ICD-10-CM | POA: Diagnosis not present

## 2019-01-06 DIAGNOSIS — Z471 Aftercare following joint replacement surgery: Secondary | ICD-10-CM | POA: Diagnosis not present

## 2019-01-08 ENCOUNTER — Encounter: Payer: Self-pay | Admitting: Surgery

## 2019-01-08 DIAGNOSIS — Z96652 Presence of left artificial knee joint: Secondary | ICD-10-CM | POA: Diagnosis not present

## 2019-01-10 NOTE — Anesthesia Postprocedure Evaluation (Signed)
Anesthesia Post Note  Patient: Kaitlyn Sanders  Procedure(s) Performed: UNICOMPARTMENTAL KNEE LEFT (Left )  Patient location during evaluation: PACU Anesthesia Type: General Level of consciousness: awake and alert Pain management: pain level controlled Vital Signs Assessment: post-procedure vital signs reviewed and stable Respiratory status: spontaneous breathing, nonlabored ventilation and respiratory function stable Cardiovascular status: blood pressure returned to baseline and stable Postop Assessment: no apparent nausea or vomiting Anesthetic complications: no     Last Vitals:  Vitals:   01/04/19 1335 01/04/19 1508  BP: 133/71 (!) 144/67  Pulse: 78 74  Resp: 18 17  Temp: 36.5 C (!) 36.4 C  SpO2: 100% 93%    Last Pain:  Vitals:   01/04/19 1514  TempSrc:   PainSc: 0-No pain                 Alphonsus Sias

## 2019-01-17 DIAGNOSIS — Z471 Aftercare following joint replacement surgery: Secondary | ICD-10-CM | POA: Diagnosis not present

## 2019-01-17 DIAGNOSIS — Z7901 Long term (current) use of anticoagulants: Secondary | ICD-10-CM | POA: Diagnosis not present

## 2019-01-17 DIAGNOSIS — I10 Essential (primary) hypertension: Secondary | ICD-10-CM | POA: Diagnosis not present

## 2019-01-17 DIAGNOSIS — Z96652 Presence of left artificial knee joint: Secondary | ICD-10-CM | POA: Diagnosis not present

## 2019-01-17 DIAGNOSIS — G4730 Sleep apnea, unspecified: Secondary | ICD-10-CM | POA: Diagnosis not present

## 2019-01-18 DIAGNOSIS — M25662 Stiffness of left knee, not elsewhere classified: Secondary | ICD-10-CM | POA: Diagnosis not present

## 2019-01-18 DIAGNOSIS — M25562 Pain in left knee: Secondary | ICD-10-CM | POA: Diagnosis not present

## 2019-01-18 DIAGNOSIS — Z96652 Presence of left artificial knee joint: Secondary | ICD-10-CM | POA: Diagnosis not present

## 2019-01-18 DIAGNOSIS — M6281 Muscle weakness (generalized): Secondary | ICD-10-CM | POA: Diagnosis not present

## 2019-01-23 DIAGNOSIS — M25562 Pain in left knee: Secondary | ICD-10-CM | POA: Diagnosis not present

## 2019-01-23 DIAGNOSIS — Z96652 Presence of left artificial knee joint: Secondary | ICD-10-CM | POA: Diagnosis not present

## 2019-01-26 DIAGNOSIS — Z96652 Presence of left artificial knee joint: Secondary | ICD-10-CM | POA: Diagnosis not present

## 2019-01-26 DIAGNOSIS — M25662 Stiffness of left knee, not elsewhere classified: Secondary | ICD-10-CM | POA: Diagnosis not present

## 2019-01-26 DIAGNOSIS — M6281 Muscle weakness (generalized): Secondary | ICD-10-CM | POA: Diagnosis not present

## 2019-01-26 DIAGNOSIS — M25562 Pain in left knee: Secondary | ICD-10-CM | POA: Diagnosis not present

## 2019-01-29 DIAGNOSIS — Z96652 Presence of left artificial knee joint: Secondary | ICD-10-CM | POA: Diagnosis not present

## 2019-01-29 DIAGNOSIS — M25562 Pain in left knee: Secondary | ICD-10-CM | POA: Diagnosis not present

## 2019-01-31 DIAGNOSIS — Z96652 Presence of left artificial knee joint: Secondary | ICD-10-CM | POA: Diagnosis not present

## 2019-01-31 DIAGNOSIS — M25562 Pain in left knee: Secondary | ICD-10-CM | POA: Diagnosis not present

## 2019-01-31 DIAGNOSIS — M6281 Muscle weakness (generalized): Secondary | ICD-10-CM | POA: Diagnosis not present

## 2019-01-31 DIAGNOSIS — M25662 Stiffness of left knee, not elsewhere classified: Secondary | ICD-10-CM | POA: Diagnosis not present

## 2019-02-06 DIAGNOSIS — M25662 Stiffness of left knee, not elsewhere classified: Secondary | ICD-10-CM | POA: Diagnosis not present

## 2019-02-06 DIAGNOSIS — Z96652 Presence of left artificial knee joint: Secondary | ICD-10-CM | POA: Diagnosis not present

## 2019-02-06 DIAGNOSIS — M25562 Pain in left knee: Secondary | ICD-10-CM | POA: Diagnosis not present

## 2019-02-06 DIAGNOSIS — M6281 Muscle weakness (generalized): Secondary | ICD-10-CM | POA: Diagnosis not present

## 2019-02-08 DIAGNOSIS — M25562 Pain in left knee: Secondary | ICD-10-CM | POA: Diagnosis not present

## 2019-02-08 DIAGNOSIS — M6281 Muscle weakness (generalized): Secondary | ICD-10-CM | POA: Diagnosis not present

## 2019-02-08 DIAGNOSIS — Z96652 Presence of left artificial knee joint: Secondary | ICD-10-CM | POA: Diagnosis not present

## 2019-02-08 DIAGNOSIS — M25662 Stiffness of left knee, not elsewhere classified: Secondary | ICD-10-CM | POA: Diagnosis not present

## 2019-02-13 DIAGNOSIS — Z96652 Presence of left artificial knee joint: Secondary | ICD-10-CM | POA: Diagnosis not present

## 2019-02-13 DIAGNOSIS — M25662 Stiffness of left knee, not elsewhere classified: Secondary | ICD-10-CM | POA: Diagnosis not present

## 2019-02-13 DIAGNOSIS — M6281 Muscle weakness (generalized): Secondary | ICD-10-CM | POA: Diagnosis not present

## 2019-02-13 DIAGNOSIS — M25562 Pain in left knee: Secondary | ICD-10-CM | POA: Diagnosis not present

## 2019-02-15 DIAGNOSIS — M25562 Pain in left knee: Secondary | ICD-10-CM | POA: Diagnosis not present

## 2019-02-15 DIAGNOSIS — Z96652 Presence of left artificial knee joint: Secondary | ICD-10-CM | POA: Diagnosis not present

## 2019-02-15 DIAGNOSIS — M25662 Stiffness of left knee, not elsewhere classified: Secondary | ICD-10-CM | POA: Diagnosis not present

## 2019-02-15 DIAGNOSIS — M6281 Muscle weakness (generalized): Secondary | ICD-10-CM | POA: Diagnosis not present

## 2019-02-20 DIAGNOSIS — M25562 Pain in left knee: Secondary | ICD-10-CM | POA: Diagnosis not present

## 2019-02-20 DIAGNOSIS — M25662 Stiffness of left knee, not elsewhere classified: Secondary | ICD-10-CM | POA: Diagnosis not present

## 2019-02-20 DIAGNOSIS — M6281 Muscle weakness (generalized): Secondary | ICD-10-CM | POA: Diagnosis not present

## 2019-02-20 DIAGNOSIS — Z96652 Presence of left artificial knee joint: Secondary | ICD-10-CM | POA: Diagnosis not present

## 2019-02-21 DIAGNOSIS — M1712 Unilateral primary osteoarthritis, left knee: Secondary | ICD-10-CM | POA: Diagnosis not present

## 2019-02-22 DIAGNOSIS — M25662 Stiffness of left knee, not elsewhere classified: Secondary | ICD-10-CM | POA: Diagnosis not present

## 2019-02-22 DIAGNOSIS — M6281 Muscle weakness (generalized): Secondary | ICD-10-CM | POA: Diagnosis not present

## 2019-02-22 DIAGNOSIS — Z96652 Presence of left artificial knee joint: Secondary | ICD-10-CM | POA: Diagnosis not present

## 2019-02-22 DIAGNOSIS — M25562 Pain in left knee: Secondary | ICD-10-CM | POA: Diagnosis not present

## 2019-02-27 DIAGNOSIS — M25562 Pain in left knee: Secondary | ICD-10-CM | POA: Diagnosis not present

## 2019-02-27 DIAGNOSIS — M6281 Muscle weakness (generalized): Secondary | ICD-10-CM | POA: Diagnosis not present

## 2019-02-27 DIAGNOSIS — Z96652 Presence of left artificial knee joint: Secondary | ICD-10-CM | POA: Diagnosis not present

## 2019-02-27 DIAGNOSIS — M25662 Stiffness of left knee, not elsewhere classified: Secondary | ICD-10-CM | POA: Diagnosis not present

## 2019-03-01 DIAGNOSIS — Z96652 Presence of left artificial knee joint: Secondary | ICD-10-CM | POA: Diagnosis not present

## 2019-03-01 DIAGNOSIS — M25562 Pain in left knee: Secondary | ICD-10-CM | POA: Diagnosis not present

## 2019-03-01 DIAGNOSIS — M6281 Muscle weakness (generalized): Secondary | ICD-10-CM | POA: Diagnosis not present

## 2019-03-01 DIAGNOSIS — M25662 Stiffness of left knee, not elsewhere classified: Secondary | ICD-10-CM | POA: Diagnosis not present

## 2019-03-07 DIAGNOSIS — M25562 Pain in left knee: Secondary | ICD-10-CM | POA: Diagnosis not present

## 2019-03-07 DIAGNOSIS — M25662 Stiffness of left knee, not elsewhere classified: Secondary | ICD-10-CM | POA: Diagnosis not present

## 2019-03-07 DIAGNOSIS — M6281 Muscle weakness (generalized): Secondary | ICD-10-CM | POA: Diagnosis not present

## 2019-03-07 DIAGNOSIS — Z96652 Presence of left artificial knee joint: Secondary | ICD-10-CM | POA: Diagnosis not present

## 2019-03-09 DIAGNOSIS — M6281 Muscle weakness (generalized): Secondary | ICD-10-CM | POA: Diagnosis not present

## 2019-03-09 DIAGNOSIS — Z96652 Presence of left artificial knee joint: Secondary | ICD-10-CM | POA: Diagnosis not present

## 2019-03-09 DIAGNOSIS — M25662 Stiffness of left knee, not elsewhere classified: Secondary | ICD-10-CM | POA: Diagnosis not present

## 2019-03-09 DIAGNOSIS — M25562 Pain in left knee: Secondary | ICD-10-CM | POA: Diagnosis not present

## 2019-03-13 DIAGNOSIS — M25562 Pain in left knee: Secondary | ICD-10-CM | POA: Diagnosis not present

## 2019-03-13 DIAGNOSIS — Z96652 Presence of left artificial knee joint: Secondary | ICD-10-CM | POA: Diagnosis not present

## 2019-03-13 DIAGNOSIS — M25662 Stiffness of left knee, not elsewhere classified: Secondary | ICD-10-CM | POA: Diagnosis not present

## 2019-03-13 DIAGNOSIS — M6281 Muscle weakness (generalized): Secondary | ICD-10-CM | POA: Diagnosis not present

## 2019-03-15 DIAGNOSIS — Z96652 Presence of left artificial knee joint: Secondary | ICD-10-CM | POA: Diagnosis not present

## 2019-03-15 DIAGNOSIS — M6281 Muscle weakness (generalized): Secondary | ICD-10-CM | POA: Diagnosis not present

## 2019-03-15 DIAGNOSIS — M25662 Stiffness of left knee, not elsewhere classified: Secondary | ICD-10-CM | POA: Diagnosis not present

## 2019-03-15 DIAGNOSIS — M25562 Pain in left knee: Secondary | ICD-10-CM | POA: Diagnosis not present

## 2019-04-11 DIAGNOSIS — Z96652 Presence of left artificial knee joint: Secondary | ICD-10-CM | POA: Diagnosis not present

## 2019-05-15 DIAGNOSIS — I10 Essential (primary) hypertension: Secondary | ICD-10-CM | POA: Diagnosis not present

## 2019-05-15 DIAGNOSIS — G4733 Obstructive sleep apnea (adult) (pediatric): Secondary | ICD-10-CM | POA: Diagnosis not present

## 2019-05-15 DIAGNOSIS — Z9989 Dependence on other enabling machines and devices: Secondary | ICD-10-CM | POA: Diagnosis not present

## 2019-05-15 DIAGNOSIS — G4700 Insomnia, unspecified: Secondary | ICD-10-CM | POA: Diagnosis not present

## 2019-05-15 DIAGNOSIS — Z23 Encounter for immunization: Secondary | ICD-10-CM | POA: Diagnosis not present

## 2019-05-15 DIAGNOSIS — M129 Arthropathy, unspecified: Secondary | ICD-10-CM | POA: Diagnosis not present

## 2019-05-31 ENCOUNTER — Other Ambulatory Visit: Payer: Self-pay

## 2019-05-31 ENCOUNTER — Ambulatory Visit
Admission: EM | Admit: 2019-05-31 | Discharge: 2019-05-31 | Disposition: A | Discharge status: Home / Self Care | Payer: PPO

## 2019-05-31 ENCOUNTER — Encounter: Payer: Self-pay | Admitting: Emergency Medicine

## 2019-05-31 DIAGNOSIS — S161XXA Strain of muscle, fascia and tendon at neck level, initial encounter: Secondary | ICD-10-CM | POA: Diagnosis not present

## 2019-05-31 DIAGNOSIS — M62838 Other muscle spasm: Secondary | ICD-10-CM

## 2019-05-31 MED ORDER — CYCLOBENZAPRINE HCL 5 MG PO TABS: 5.0000 mg | ORAL_TABLET | Freq: Two times a day (BID) | ORAL | 0 refills | Status: DC | PRN

## 2019-05-31 MED ORDER — KETOROLAC TROMETHAMINE 10 MG PO TABS: 10.0000 mg | ORAL_TABLET | Freq: Three times a day (TID) | ORAL | 0 refills | Status: DC | PRN
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
## 2019-05-31 NOTE — Discharge Instructions (Addendum)
It was very nice seeing you today in clinic. Thank you for entrusting me with your care.   As discussed, your pain seems to be musculoskeletal in nature. You have some associated muscle spasm. Plans for treating you are as follows:  Please utilize the medications that we discussed. Your prescriptions have been called in to your pharmacy.  Be CAREFUL with the muscle relaxers as they can make you sleepy. Start off with the 5 mg dose (1 pill) to see how it will effect you.  Avoid overdoing it, but you need to make efforts to remain active as tolerated.  Avoiding activity all together can make your pain worse. You may find that alternating between ice and moist heat application will help with your pain.  Heat/ice should be applied for 15-20 minutes at a time at least 3-4 times a day.  Make arrangements to follow up with your regular doctor in 1 week for re-evaluation. If your symptoms/condition worsens, please seek follow up care either here or in the ER. Please remember, our [place] providers are "right here with you" when you need [place].   Again, it was my pleasure to take care of you today. Thank you for choosing our clinic. I hope that you start to feel better quickly.   Honor Loh, MSN, APRN, FNP-C, CEN Advanced Practice Provider [facility]
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
## 2019-06-07 DIAGNOSIS — M542 Cervicalgia: Secondary | ICD-10-CM | POA: Diagnosis not present

## 2019-06-07 DIAGNOSIS — M546 Pain in thoracic spine: Secondary | ICD-10-CM | POA: Diagnosis not present

## 2019-06-07 DIAGNOSIS — F5104 Psychophysiologic insomnia: Secondary | ICD-10-CM | POA: Diagnosis not present

## 2019-06-08 DIAGNOSIS — F4321 Adjustment disorder with depressed mood: Secondary | ICD-10-CM | POA: Insufficient documentation

## 2019-06-08 DIAGNOSIS — M8588 Other specified disorders of bone density and structure, other site: Secondary | ICD-10-CM | POA: Insufficient documentation

## 2019-06-13 DIAGNOSIS — Z96652 Presence of left artificial knee joint: Secondary | ICD-10-CM | POA: Diagnosis not present

## 2019-06-13 DIAGNOSIS — M1712 Unilateral primary osteoarthritis, left knee: Secondary | ICD-10-CM | POA: Diagnosis not present

## 2019-06-14 DIAGNOSIS — M542 Cervicalgia: Secondary | ICD-10-CM | POA: Diagnosis not present

## 2019-06-14 DIAGNOSIS — M6281 Muscle weakness (generalized): Secondary | ICD-10-CM | POA: Diagnosis not present

## 2019-06-18 DIAGNOSIS — M542 Cervicalgia: Secondary | ICD-10-CM | POA: Diagnosis not present

## 2019-06-18 DIAGNOSIS — M6281 Muscle weakness (generalized): Secondary | ICD-10-CM | POA: Diagnosis not present

## 2019-06-20 DIAGNOSIS — M542 Cervicalgia: Secondary | ICD-10-CM | POA: Diagnosis not present

## 2019-06-20 DIAGNOSIS — M6281 Muscle weakness (generalized): Secondary | ICD-10-CM | POA: Diagnosis not present

## 2019-06-25 DIAGNOSIS — M6281 Muscle weakness (generalized): Secondary | ICD-10-CM | POA: Diagnosis not present

## 2019-06-25 DIAGNOSIS — M542 Cervicalgia: Secondary | ICD-10-CM | POA: Diagnosis not present

## 2019-07-02 DIAGNOSIS — M6281 Muscle weakness (generalized): Secondary | ICD-10-CM | POA: Diagnosis not present

## 2019-07-02 DIAGNOSIS — M542 Cervicalgia: Secondary | ICD-10-CM | POA: Diagnosis not present

## 2019-07-05 DIAGNOSIS — M6281 Muscle weakness (generalized): Secondary | ICD-10-CM | POA: Diagnosis not present

## 2019-07-05 DIAGNOSIS — M542 Cervicalgia: Secondary | ICD-10-CM | POA: Diagnosis not present

## 2019-07-09 DIAGNOSIS — F5104 Psychophysiologic insomnia: Secondary | ICD-10-CM | POA: Diagnosis not present

## 2019-07-09 DIAGNOSIS — M542 Cervicalgia: Secondary | ICD-10-CM | POA: Diagnosis not present

## 2019-07-09 DIAGNOSIS — F4321 Adjustment disorder with depressed mood: Secondary | ICD-10-CM | POA: Diagnosis not present

## 2019-07-10 DIAGNOSIS — M6281 Muscle weakness (generalized): Secondary | ICD-10-CM | POA: Diagnosis not present

## 2019-07-10 DIAGNOSIS — M542 Cervicalgia: Secondary | ICD-10-CM | POA: Diagnosis not present

## 2019-07-12 DIAGNOSIS — M542 Cervicalgia: Secondary | ICD-10-CM | POA: Diagnosis not present

## 2019-07-12 DIAGNOSIS — M6281 Muscle weakness (generalized): Secondary | ICD-10-CM | POA: Diagnosis not present

## 2019-07-17 DIAGNOSIS — M542 Cervicalgia: Secondary | ICD-10-CM | POA: Diagnosis not present

## 2019-07-17 DIAGNOSIS — M6281 Muscle weakness (generalized): Secondary | ICD-10-CM | POA: Diagnosis not present

## 2019-08-29 DIAGNOSIS — Z96652 Presence of left artificial knee joint: Secondary | ICD-10-CM | POA: Diagnosis not present

## 2019-11-21 ENCOUNTER — Other Ambulatory Visit: Payer: Self-pay | Admitting: Surgery

## 2019-11-21 DIAGNOSIS — E782 Mixed hyperlipidemia: Secondary | ICD-10-CM | POA: Diagnosis not present

## 2019-11-21 DIAGNOSIS — I10 Essential (primary) hypertension: Secondary | ICD-10-CM | POA: Diagnosis not present

## 2019-11-21 DIAGNOSIS — G4700 Insomnia, unspecified: Secondary | ICD-10-CM | POA: Diagnosis not present

## 2019-11-21 DIAGNOSIS — Z0000 Encounter for general adult medical examination without abnormal findings: Secondary | ICD-10-CM | POA: Diagnosis not present

## 2019-11-21 DIAGNOSIS — Z96652 Presence of left artificial knee joint: Secondary | ICD-10-CM

## 2019-11-21 DIAGNOSIS — M1712 Unilateral primary osteoarthritis, left knee: Secondary | ICD-10-CM

## 2019-11-21 DIAGNOSIS — G4733 Obstructive sleep apnea (adult) (pediatric): Secondary | ICD-10-CM | POA: Diagnosis not present

## 2019-11-21 DIAGNOSIS — Z9989 Dependence on other enabling machines and devices: Secondary | ICD-10-CM | POA: Diagnosis not present

## 2019-11-21 DIAGNOSIS — M545 Low back pain: Secondary | ICD-10-CM | POA: Diagnosis not present

## 2019-11-21 DIAGNOSIS — M129 Arthropathy, unspecified: Secondary | ICD-10-CM | POA: Diagnosis not present

## 2019-11-22 DIAGNOSIS — E782 Mixed hyperlipidemia: Secondary | ICD-10-CM | POA: Diagnosis not present

## 2019-12-07 ENCOUNTER — Other Ambulatory Visit: Payer: Self-pay

## 2019-12-07 ENCOUNTER — Ambulatory Visit
Admission: RE | Admit: 2019-12-07 | Discharge: 2019-12-07 | Disposition: A | Discharge status: Home / Self Care | Payer: PPO | Source: Ambulatory Visit | Attending: Surgery | Admitting: Surgery

## 2019-12-07 DIAGNOSIS — Z96652 Presence of left artificial knee joint: Secondary | ICD-10-CM

## 2019-12-07 DIAGNOSIS — M25562 Pain in left knee: Secondary | ICD-10-CM | POA: Diagnosis not present

## 2019-12-07 DIAGNOSIS — M1712 Unilateral primary osteoarthritis, left knee: Secondary | ICD-10-CM | POA: Diagnosis not present

## 2019-12-10 DIAGNOSIS — E875 Hyperkalemia: Secondary | ICD-10-CM | POA: Diagnosis not present

## 2019-12-24 DIAGNOSIS — M25562 Pain in left knee: Secondary | ICD-10-CM | POA: Diagnosis not present

## 2019-12-24 DIAGNOSIS — M7122 Synovial cyst of popliteal space [Baker], left knee: Secondary | ICD-10-CM | POA: Diagnosis not present

## 2019-12-24 DIAGNOSIS — M1712 Unilateral primary osteoarthritis, left knee: Secondary | ICD-10-CM | POA: Diagnosis not present

## 2020-01-09 ENCOUNTER — Other Ambulatory Visit: Payer: Self-pay | Admitting: Gerontology

## 2020-01-09 ENCOUNTER — Ambulatory Visit
Admission: RE | Admit: 2020-01-09 | Discharge: 2020-01-09 | Disposition: A | Discharge status: Home / Self Care | Payer: PPO | Source: Ambulatory Visit | Attending: Gerontology | Admitting: Gerontology

## 2020-01-09 ENCOUNTER — Other Ambulatory Visit: Payer: Self-pay

## 2020-01-09 DIAGNOSIS — R1030 Lower abdominal pain, unspecified: Secondary | ICD-10-CM

## 2020-01-09 DIAGNOSIS — R1031 Right lower quadrant pain: Secondary | ICD-10-CM | POA: Diagnosis not present

## 2020-01-09 DIAGNOSIS — R102 Pelvic and perineal pain: Secondary | ICD-10-CM | POA: Diagnosis not present

## 2020-01-09 DIAGNOSIS — R1032 Left lower quadrant pain: Secondary | ICD-10-CM | POA: Diagnosis not present

## 2020-01-09 DIAGNOSIS — R109 Unspecified abdominal pain: Secondary | ICD-10-CM | POA: Diagnosis not present

## 2020-01-10 ENCOUNTER — Other Ambulatory Visit: Payer: Self-pay | Admitting: Gerontology

## 2020-01-10 DIAGNOSIS — R1030 Lower abdominal pain, unspecified: Secondary | ICD-10-CM

## 2020-01-16 DIAGNOSIS — I739 Peripheral vascular disease, unspecified: Secondary | ICD-10-CM | POA: Diagnosis not present

## 2020-01-16 DIAGNOSIS — M47816 Spondylosis without myelopathy or radiculopathy, lumbar region: Secondary | ICD-10-CM | POA: Diagnosis not present

## 2020-02-05 ENCOUNTER — Other Ambulatory Visit (INDEPENDENT_AMBULATORY_CARE_PROVIDER_SITE_OTHER): Payer: Self-pay | Admitting: Vascular Surgery

## 2020-02-05 DIAGNOSIS — I739 Peripheral vascular disease, unspecified: Secondary | ICD-10-CM

## 2020-02-12 ENCOUNTER — Encounter (INDEPENDENT_AMBULATORY_CARE_PROVIDER_SITE_OTHER): Payer: Self-pay | Admitting: Vascular Surgery

## 2020-02-12 ENCOUNTER — Ambulatory Visit (INDEPENDENT_AMBULATORY_CARE_PROVIDER_SITE_OTHER): Payer: PPO

## 2020-02-12 ENCOUNTER — Other Ambulatory Visit: Payer: Self-pay

## 2020-02-12 ENCOUNTER — Ambulatory Visit (INDEPENDENT_AMBULATORY_CARE_PROVIDER_SITE_OTHER): Payer: PPO | Admitting: Vascular Surgery

## 2020-02-12 DIAGNOSIS — I739 Peripheral vascular disease, unspecified: Secondary | ICD-10-CM | POA: Diagnosis not present

## 2020-02-12 DIAGNOSIS — M79609 Pain in unspecified limb: Secondary | ICD-10-CM | POA: Insufficient documentation

## 2020-02-12 DIAGNOSIS — E785 Hyperlipidemia, unspecified: Secondary | ICD-10-CM | POA: Insufficient documentation

## 2020-02-12 DIAGNOSIS — M79605 Pain in left leg: Secondary | ICD-10-CM | POA: Diagnosis not present

## 2020-02-12 DIAGNOSIS — I10 Essential (primary) hypertension: Secondary | ICD-10-CM | POA: Diagnosis not present

## 2020-02-12 NOTE — Assessment & Plan Note (Signed)
To evaluate the patient for an arterial cause of her pain, noninvasive studies were performed today.  These demonstrate normal ABIs of 1.04 on the right and 1.06 on the left with brisk multiphasic waveforms and normal digital pressures bilaterally consistent with no arterial insufficiency in either lower extremity.  It does not appear as if her lower extremity symptoms are of a vascular origin.  With the coolness in the leg and the pain radiating down the medial portion of the leg, neuropathic pain would be suspected at this point.  In discussions with the patient who is desirous to have further evaluation of this, I think a referral to neurology would be reasonable for nerve conduction studies or other evaluation of the symptoms.  No further vascular work-up or treatment is planned at this time.  I will see her back as needed.

## 2020-02-12 NOTE — Assessment & Plan Note (Signed)
lipid control important in reducing the progression of atherosclerotic disease. Continue statin therapy  

## 2020-02-12 NOTE — Assessment & Plan Note (Signed)
blood pressure control important in reducing the progression of atherosclerotic disease. On appropriate oral medications.  

## 2020-02-12 NOTE — Progress Notes (Signed)
Patient ID: Kaitlyn Sanders, female   DOB: 11/03/1945, 74 y.o.   MRN: [id number]  Chief Complaint  Patient presents with   New Patient (Initial Visit)    ref Poggi evaluate for vascular claudication    HPI Kaitlyn Sanders is a 74 y.o. female.  I am asked to see the patient by Dr. Joice Lofts for evaluation of peripheral arterial disease as a cause of her lower extremity pain.  She is well familiar with peripheral arterial disease to the care of her husband for this issue in the past.  Her pain basically started following her knee replacement on the left which otherwise went well.  The pain was not in her knee but was largely from the groin and the inner thigh radiating down her legs.  This is associated with worsening with activity.  There were also episodes of coolness of the extremities and when her orthopedic surgeon noticed her pulses were not strong, she is referred for vascular evaluation as the cause of the pain.  No open wounds or infection.  No pain that wakes her from night.  She has not previously had any vascular or atherosclerotic issues to her knowledge.  The pain has been going on for about a year now.     Past Medical History:  Diagnosis Date   Arthritis    Dyspnea    Hypertension    Sleep apnea     Past Surgical History:  Procedure Laterality Date   ABDOMINAL HYSTERECTOMY     CHOLECYSTECTOMY     KNEE ARTHROSCOPY Right 2007   PARTIAL KNEE ARTHROPLASTY Left 01/04/2019   Procedure: UNICOMPARTMENTAL KNEE LEFT;  Surgeon: Christena Flake, MD;  Location: [facility] OR[facility];  Service: Orthopedics;  Laterality: Left;     Family History  Problem Relation Age of Onset   Cancer Mother    Deep vein thrombosis Sister   No bleeding disorders. No aneurysms   Social History   Tobacco Use   Smoking status: Former Smoker    Packs/day: 0.25    Years: 64.00    Pack years: 16.00    Types: Cigarettes    Quit date: 01/20/2019    Years since quitting: 1.0   Smokeless  tobacco: Never Used  Vaping Use   Vaping Use: Never used  Substance Use Topics   Alcohol use: No   Drug use: Not Currently    Allergies  Allergen Reactions   Bee Venom Anaphylaxis   Shrimp [Shellfish Allergy] Itching, Swelling and Other (See Comments)    Throat closes   Contrast Media [Iodinated Diagnostic Agents]     Current Outpatient Medications  Medication Sig Dispense Refill   ascorbic acid (VITAMIN C) 500 MG tablet Take 500 mg by mouth daily.     atorvastatin (LIPITOR) 10 MG tablet Take 10 mg by mouth daily.     cholecalciferol (VITAMIN D3) 25 MCG (1000 UNIT) tablet Take 1,000 Units by mouth daily.     loratadine (CLARITIN) 10 MG tablet Take 10 mg by mouth daily. Liquigel     Multiple Vitamin (MULTIVITAMIN WITH MINERALS) TABS tablet Take 1 tablet by mouth daily. Centrum Silver     traZODone (DESYREL) 100 MG tablet Take 1 tablet (100 mg total) by mouth at bedtime as needed for sleep. (Patient taking differently: Take 100 mg by mouth as needed. ) 30 tablet 0   cyclobenzaprine (FLEXERIL) 5 MG tablet Take 1-2 tablets (5-10 mg total) by mouth 2 (two) times daily as needed for muscle spasms.  START with lower dose as it can make you sleepy. (Patient not taking: Reported on 02/12/2020) 10 tablet 0   gabapentin (NEURONTIN) 100 MG capsule Take 100 mg by mouth 3 (three) times daily. (Patient not taking: Reported on 02/12/2020)     ipratropium (ATROVENT) 0.06 % nasal spray Place 2 sprays into both nostrils 4 (four) times daily as needed for rhinitis. (Patient not taking: Reported on 02/12/2020) 15 mL 0   ketorolac (TORADOL) 10 MG tablet Take 1 tablet (10 mg total) by mouth every 8 (eight) hours as needed. (Patient not taking: Reported on 02/12/2020) 15 tablet 0   OVER THE COUNTER MEDICATION Apply 1 application topically daily as needed (pain). CBD Salve (Patient not taking: Reported on 02/12/2020)     valsartan (DIOVAN) 80 MG tablet Take 80 mg by mouth daily. (Patient not taking:  Reported on 02/12/2020)     No current facility-administered medications for this visit.      REVIEW OF SYSTEMS (Negative unless checked)  Constitutional: [] Weight loss  [] Fever  [] Chills Cardiac: [] Chest pain   [] Chest pressure   [] Palpitations   [] Shortness of breath when laying flat   [] Shortness of breath at rest   [x] Shortness of breath with exertion. Vascular:  [x] Pain in legs with walking   [x] Pain in legs at rest   [] Pain in legs when laying flat   [] Claudication   [] Pain in feet when walking  [] Pain in feet at rest  [] Pain in feet when laying flat   [] History of DVT   [] Phlebitis   [] Swelling in legs   [] Varicose veins   [] Non-healing ulcers Pulmonary:   [] Uses home oxygen   [] Productive cough   [] Hemoptysis   [] Wheeze  [] COPD   [] Asthma Neurologic:  [] Dizziness  [] Blackouts   [] Seizures   [] History of stroke   [] History of TIA  [] Aphasia   [] Temporary blindness   [] Dysphagia   [] Weakness or numbness in arms   [] Weakness or numbness in legs Musculoskeletal:  [x] Arthritis   [] Joint swelling   [x] Joint pain   [] Low back pain Hematologic:  [] Easy bruising  [] Easy bleeding   [] Hypercoagulable state   [] Anemic  [] Hepatitis Gastrointestinal:  [] Blood in stool   [] Vomiting blood  [] Gastroesophageal reflux/heartburn   [] Abdominal pain Genitourinary:  [] Chronic kidney disease   [] Difficult urination  [] Frequent urination  [] Burning with urination   [] Hematuria Skin:  [] Rashes   [] Ulcers   [] Wounds Psychological:  [] History of anxiety   []  History of major depression.    Physical Exam BP (!) 169/75 (BP Location: Right Arm)    Pulse 79    Resp 16    Ht 5' 3.5" (1.613 m)    Wt 200 lb 12.8 oz (91.1 kg)    BMI 35.01 kg/m  Gen:  WD/WN, NAD Head: [place]/AT, No temporalis wasting.  Ear/Nose/Throat: Hearing grossly intact, nares w/o erythema or drainage, oropharynx w/o Erythema/Exudate Eyes: Conjunctiva clear, sclera non-icteric  Neck: trachea midline.  No JVD.  Pulmonary:  Good air movement,  respirations not labored, no use of accessory muscles  Cardiac: RRR, no JVD Vascular:  Vessel Right Left  Radial Palpable Palpable                          PT  1+  1+  DP  2+  2+   Gastrointestinal:. No masses, surgical incisions, or scars. Musculoskeletal: M/S 5/5 throughout.  Extremities without ischemic changes.  No deformity or atrophy.  Trace lower extremity edema.  Neurologic: Sensation grossly intact in extremities.  Symmetrical.  Speech is fluent. Motor exam as listed above. Psychiatric: Judgment intact, Mood & affect appropriate for pt's clinical situation. Dermatologic: No rashes or ulcers noted.  No cellulitis or open wounds.    Radiology No results found.  Labs No results found for this or any previous visit (from the past 2160 hour(s)).  Assessment/Plan:  Essential hypertension blood pressure control important in reducing the progression of atherosclerotic disease. On appropriate oral medications.   Hyperlipidemia lipid control important in reducing the progression of atherosclerotic disease. Continue statin therapy   Pain in limb To evaluate the patient for an arterial cause of her pain, noninvasive studies were performed today.  These demonstrate normal ABIs of 1.04 on the right and 1.06 on the left with brisk multiphasic waveforms and normal digital pressures bilaterally consistent with no arterial insufficiency in either lower extremity.  It does not appear as if her lower extremity symptoms are of a vascular origin.  With the coolness in the leg and the pain radiating down the medial portion of the leg, neuropathic pain would be suspected at this point.  In discussions with the patient who is desirous to have further evaluation of this, I think a referral to neurology would be reasonable for nerve conduction studies or other evaluation of the symptoms.  No further vascular work-up or treatment is planned at this time.  I will see her back as  needed.      Festus Barren 02/12/2020, 9:09 AM   This note was created with Dragon medical transcription system.  Any errors from dictation are unintentional.

## 2020-02-15 ENCOUNTER — Telehealth (INDEPENDENT_AMBULATORY_CARE_PROVIDER_SITE_OTHER): Payer: Self-pay | Admitting: Vascular Surgery

## 2020-02-21 DIAGNOSIS — G4733 Obstructive sleep apnea (adult) (pediatric): Secondary | ICD-10-CM | POA: Diagnosis not present

## 2020-03-18 DIAGNOSIS — M654 Radial styloid tenosynovitis [de Quervain]: Secondary | ICD-10-CM | POA: Diagnosis not present

## 2020-03-27 DIAGNOSIS — M25552 Pain in left hip: Secondary | ICD-10-CM | POA: Diagnosis not present

## 2020-03-27 DIAGNOSIS — M7062 Trochanteric bursitis, left hip: Secondary | ICD-10-CM | POA: Diagnosis not present

## 2020-03-27 DIAGNOSIS — M79605 Pain in left leg: Secondary | ICD-10-CM | POA: Diagnosis not present

## 2020-03-27 DIAGNOSIS — M7072 Other bursitis of hip, left hip: Secondary | ICD-10-CM | POA: Diagnosis not present

## 2020-04-18 DIAGNOSIS — M25552 Pain in left hip: Secondary | ICD-10-CM | POA: Diagnosis not present

## 2020-04-18 DIAGNOSIS — M7602 Gluteal tendinitis, left hip: Secondary | ICD-10-CM | POA: Diagnosis not present

## 2020-04-25 DIAGNOSIS — M25552 Pain in left hip: Secondary | ICD-10-CM | POA: Diagnosis not present

## 2020-04-25 DIAGNOSIS — M79605 Pain in left leg: Secondary | ICD-10-CM | POA: Diagnosis not present

## 2020-04-25 DIAGNOSIS — M7062 Trochanteric bursitis, left hip: Secondary | ICD-10-CM | POA: Diagnosis not present

## 2020-04-25 DIAGNOSIS — M7072 Other bursitis of hip, left hip: Secondary | ICD-10-CM | POA: Diagnosis not present

## 2020-06-24 ENCOUNTER — Ambulatory Visit
Admission: RE | Admit: 2020-06-24 | Discharge: 2020-06-24 | Disposition: A | Discharge status: Home / Self Care | Payer: PPO | Source: Ambulatory Visit | Attending: Family Medicine | Admitting: Family Medicine

## 2020-06-24 ENCOUNTER — Other Ambulatory Visit: Payer: Self-pay

## 2020-06-24 ENCOUNTER — Ambulatory Visit (INDEPENDENT_AMBULATORY_CARE_PROVIDER_SITE_OTHER): Payer: PPO

## 2020-06-24 VITALS — BP 117/86 | HR 79 | Temp 97.8°F | Resp 20 | Ht 63.5 in | Wt 200.8 lb

## 2020-06-24 DIAGNOSIS — R059 Cough, unspecified: Secondary | ICD-10-CM

## 2020-06-24 DIAGNOSIS — J209 Acute bronchitis, unspecified: Secondary | ICD-10-CM

## 2020-06-24 MED ORDER — DOXYCYCLINE HYCLATE 100 MG PO CAPS: 100.0000 mg | ORAL_CAPSULE | Freq: Two times a day (BID) | ORAL | 0 refills | Status: DC

## 2020-06-24 MED ORDER — PREDNISONE 50 MG PO TABS: ORAL_TABLET | ORAL | 0 refills | Status: DC

## 2020-06-24 MED ORDER — BENZONATATE 200 MG PO CAPS: 200.0000 mg | ORAL_CAPSULE | Freq: Three times a day (TID) | ORAL | 0 refills | Status: DC | PRN

## 2020-06-24 NOTE — ED Triage Notes (Signed)
Pt c/o cough, nasal congestion, runny nose, and pain in chest when coughing. Started about 4 days ago. She states she was exposed to covid but she was tested yesterday and was negative.

## 2020-06-24 NOTE — ED Provider Notes (Signed)
[facility]    CSN: [id number] Arrival date & time: 06/24/20  0848      History   Chief Complaint Chief Complaint  Patient presents with  . Cough   HPI  74 year old female presents with respiratory symptoms.  Patient reports that she has been sick for about 4 days.  She reports ongoing cough.  She reports that she has had some fatigue, runny nose and congestion as well.  However, predominant symptom is cough.  She is a former smoker.  Does not carry a diagnosis of COPD but smoked for over 50 years.  No shortness of breath.  No fever.  Neighbor recently tested positive for Covid.  She had Covid testing yesterday which was negative.  No other associated symptoms.  No other complaints.  Past Medical History:  Diagnosis Date  . Arthritis   . Dyspnea   . Hypertension   . Sleep apnea     Patient Active Problem List   Diagnosis Date Noted  . Essential hypertension 02/12/2020  . Hyperlipidemia 02/12/2020  . Pain in limb 02/12/2020  . Osteopenia of spine 06/08/2019  . Situational depression 06/08/2019  . Acute neck pain 06/07/2019  . Psychophysiological insomnia 06/07/2019  . Status post left partial knee replacement 01/04/2019  . Primary osteoarthritis of first carpometacarpal joint of right hand 03/30/2017  . Primary osteoarthritis of left knee 03/30/2017  . Spondylosis of lumbar region without myelopathy or radiculopathy 07/08/2015  . Obstructive sleep apnea (adult) (pediatric) 06/16/2015    Past Surgical History:  Procedure Laterality Date  . ABDOMINAL HYSTERECTOMY    . CHOLECYSTECTOMY    . KNEE ARTHROSCOPY Right 2007  . PARTIAL KNEE ARTHROPLASTY Left 01/04/2019   Procedure: UNICOMPARTMENTAL KNEE LEFT;  Surgeon: Christena Flake, MD;  Location: [facility] OR[facility];  Service: Orthopedics;  Laterality: Left;    OB History   No obstetric history on file.      Home Medications    Prior to Admission medications   Medication Sig Start Date End Date Taking? Authorizing  Provider  ascorbic acid (VITAMIN C) 500 MG tablet Take 500 mg by mouth daily.   Yes [provider]  atorvastatin (LIPITOR) 10 MG tablet Take 10 mg by mouth daily. 05/22/19  Yes [provider]  cholecalciferol (VITAMIN D3) 25 MCG (1000 UNIT) tablet Take 1,000 Units by mouth daily.   Yes [provider]  gabapentin (NEURONTIN) 100 MG capsule Take 100 mg by mouth 3 (three) times daily.  05/15/19  Yes [provider]  loratadine (CLARITIN) 10 MG tablet Take 10 mg by mouth daily. Liquigel   Yes [provider]  Multiple Vitamin (MULTIVITAMIN WITH MINERALS) TABS tablet Take 1 tablet by mouth daily. Centrum Silver   Yes [provider]  traZODone (DESYREL) 100 MG tablet Take 1 tablet (100 mg total) by mouth at bedtime as needed for sleep. Patient taking differently: Take 100 mg by mouth as needed.  07/18/18  Yes Elven Laboy G, DO  benzonatate (TESSALON) 200 MG capsule Take 1 capsule (200 mg total) by mouth 3 (three) times daily as needed for cough. 06/24/20   Tommie Sams, DO  doxycycline (VIBRAMYCIN) 100 MG capsule Take 1 capsule (100 mg total) by mouth 2 (two) times daily. 06/24/20   Tommie Sams, DO  predniSONE (DELTASONE) 50 MG tablet 1 tablet daily x 5 days 06/24/20   Tommie Sams, DO  apixaban (ELIQUIS) 2.5 MG TABS tablet Take 1 tablet (2.5 mg total) by mouth 2 (two)  times daily. 01/05/19 05/31/19  Anson [place], PA-C  buPROPion (WELLBUTRIN XL) 150 MG 24 hr tablet Take 150 mg by mouth daily. 08/28/18 05/31/19  [provider]  ipratropium (ATROVENT) 0.06 % nasal spray Place 2 sprays into both nostrils 4 (four) times daily as needed for rhinitis. Patient not taking: Reported on 02/12/2020 07/18/18 06/24/20  Tommie Sams, DO  valsartan (DIOVAN) 80 MG tablet Take 80 mg by mouth daily. Patient not taking: Reported on 02/12/2020  06/24/20  [provider]    Family History Family History  Problem Relation Age of Onset  .  Cancer Mother   . Deep vein thrombosis Sister     Social History Social History   Tobacco Use  . Smoking status: Former Smoker    Packs/day: 0.25    Years: 64.00    Pack years: 16.00    Types: Cigarettes    Quit date: 01/20/2019    Years since quitting: 1.4  . Smokeless tobacco: Never Used  Vaping Use  . Vaping Use: Never used  Substance Use Topics  . Alcohol use: No  . Drug use: Not Currently     Allergies   Bee venom, Shrimp [shellfish allergy], and Contrast media [iodinated diagnostic agents]   Review of Systems Review of Systems  Constitutional: Positive for fatigue. Negative for fever.  HENT: Positive for congestion and rhinorrhea.   Respiratory: Positive for cough.    Physical Exam Triage Vital Signs ED Triage Vitals  Enc Vitals Group     BP 06/24/20 0913 117/86     Pulse Rate 06/24/20 0913 79     Resp 06/24/20 0913 20     Temp 06/24/20 0913 97.8 F (36.6 C)     Temp Source 06/24/20 0913 Oral     SpO2 06/24/20 0913 100 %     Weight 06/24/20 0911 200 lb 13.4 oz (91.1 kg)     Height 06/24/20 0911 5' 3.5" (1.613 m)     Head Circumference --      Peak Flow --      Pain Score 06/24/20 0911 6     Pain Loc --      Pain Edu? --      Excl. in GC? --    Updated Vital Signs BP 117/86 (BP Location: Right Arm)   Pulse 79   Temp 97.8 F (36.6 C) (Oral)   Resp 20   Ht 5' 3.5" (1.613 m)   Wt 91.1 kg   SpO2 100%   BMI 35.02 kg/m   Visual Acuity Right Eye Distance:   Left Eye Distance:   Bilateral Distance:    Right Eye Near:   Left Eye Near:    Bilateral Near:     Physical Exam Vitals and nursing note reviewed.  Constitutional:      General: She is not in acute distress.    Appearance: Normal appearance. She is not ill-appearing.  HENT:     Head: Normocephalic and atraumatic.     Mouth/Throat:     Pharynx: Oropharynx is clear. No oropharyngeal exudate or posterior oropharyngeal erythema.  Eyes:     General:        Right eye: No discharge.         Left eye: No discharge.     Conjunctiva/sclera: Conjunctivae normal.  Cardiovascular:     Rate and Rhythm: Normal rate and regular rhythm.  Pulmonary:     Effort: Pulmonary effort is normal.     Breath sounds: No wheezing  or rales.  Neurological:     Mental Status: She is alert.  Psychiatric:        Mood and Affect: Mood normal.        Behavior: Behavior normal.     UC Treatments / Results  Labs (all labs ordered are listed, but only abnormal results are displayed) Labs Reviewed - No data to display  EKG   Radiology DG Chest 2 View  Result Date: 06/24/2020 CLINICAL DATA:  Cough EXAM: CHEST - 2 VIEW COMPARISON:  03/03/2016 FINDINGS: Heart size is normal. Chronic aortic atherosclerotic calcification as seen previously. Worsening of interstitial lung markings and bronchial thickening since the previous study. No dense consolidation, collapse or effusion. The findings could go along with bronchitis or possibly an element of interstitial lung disease. No significant bone finding. IMPRESSION: Worsening of interstitial lung markings and bronchial thickening. The findings could go along with bronchitis or possibly an element of interstitial lung disease. No dense consolidation or collapse. Electronically Signed   By: Paulina Fusi M.D.   On: 06/24/2020 09:54    Procedures Procedures (including critical care time)  Medications Ordered in UC Medications - No data to display  Initial Impression / Assessment and Plan / UC Course  I have reviewed the triage vital signs and the nursing notes.  Pertinent labs & imaging results that were available during my care of the patient were reviewed by me and considered in my medical decision making (see chart for details).    74 year old female presents with bronchitis.  I suspect the patient has underlying COPD.  Given her x-ray findings in the setting of likely COPD, I am treating her with doxycycline, prednisone.  Tessalon Perles for  cough.  Final Clinical Impressions(s) / UC Diagnoses   Final diagnoses:  Acute bronchitis, unspecified organism   Discharge Instructions   None    ED Prescriptions    Medication Sig Dispense Auth. Provider   doxycycline (VIBRAMYCIN) 100 MG capsule Take 1 capsule (100 mg total) by mouth 2 (two) times daily. 14 capsule Jamesyn Lindell G, DO   predniSONE (DELTASONE) 50 MG tablet 1 tablet daily x 5 days 5 tablet Salisha Bardsley G, DO   benzonatate (TESSALON) 200 MG capsule Take 1 capsule (200 mg total) by mouth 3 (three) times daily as needed for cough. 30 capsule Tommie Sams, DO     PDMP not reviewed this encounter.   Tommie Sams, [place] 06/24/20 1044

## 2020-07-02 DIAGNOSIS — M7072 Other bursitis of hip, left hip: Secondary | ICD-10-CM | POA: Diagnosis not present

## 2020-07-02 DIAGNOSIS — M7062 Trochanteric bursitis, left hip: Secondary | ICD-10-CM | POA: Diagnosis not present

## 2020-07-02 DIAGNOSIS — M7602 Gluteal tendinitis, left hip: Secondary | ICD-10-CM | POA: Diagnosis not present

## 2020-07-03 DIAGNOSIS — I10 Essential (primary) hypertension: Secondary | ICD-10-CM | POA: Diagnosis not present

## 2020-07-03 DIAGNOSIS — G4700 Insomnia, unspecified: Secondary | ICD-10-CM | POA: Diagnosis not present

## 2020-07-03 DIAGNOSIS — E782 Mixed hyperlipidemia: Secondary | ICD-10-CM | POA: Diagnosis not present

## 2020-07-03 DIAGNOSIS — M129 Arthropathy, unspecified: Secondary | ICD-10-CM | POA: Diagnosis not present

## 2020-07-03 DIAGNOSIS — Z23 Encounter for immunization: Secondary | ICD-10-CM | POA: Diagnosis not present

## 2020-07-03 DIAGNOSIS — Z9989 Dependence on other enabling machines and devices: Secondary | ICD-10-CM | POA: Diagnosis not present

## 2020-07-03 DIAGNOSIS — G4733 Obstructive sleep apnea (adult) (pediatric): Secondary | ICD-10-CM | POA: Diagnosis not present

## 2020-07-11 DIAGNOSIS — R7301 Impaired fasting glucose: Secondary | ICD-10-CM | POA: Diagnosis not present

## 2020-07-16 DIAGNOSIS — G8929 Other chronic pain: Secondary | ICD-10-CM | POA: Diagnosis not present

## 2020-07-16 DIAGNOSIS — M25552 Pain in left hip: Secondary | ICD-10-CM | POA: Diagnosis not present

## 2020-07-16 DIAGNOSIS — M6281 Muscle weakness (generalized): Secondary | ICD-10-CM | POA: Diagnosis not present

## 2020-07-31 DIAGNOSIS — G8929 Other chronic pain: Secondary | ICD-10-CM | POA: Diagnosis not present

## 2020-07-31 DIAGNOSIS — M25552 Pain in left hip: Secondary | ICD-10-CM | POA: Diagnosis not present

## 2020-08-04 ENCOUNTER — Other Ambulatory Visit: Payer: Self-pay | Admitting: Surgery

## 2020-08-04 DIAGNOSIS — M7602 Gluteal tendinitis, left hip: Secondary | ICD-10-CM

## 2020-08-04 DIAGNOSIS — M7062 Trochanteric bursitis, left hip: Secondary | ICD-10-CM

## 2020-08-05 ENCOUNTER — Other Ambulatory Visit: Payer: Self-pay | Admitting: Surgery

## 2020-08-05 DIAGNOSIS — M47816 Spondylosis without myelopathy or radiculopathy, lumbar region: Secondary | ICD-10-CM

## 2020-08-05 DIAGNOSIS — M5440 Lumbago with sciatica, unspecified side: Secondary | ICD-10-CM

## 2020-08-05 DIAGNOSIS — G8929 Other chronic pain: Secondary | ICD-10-CM

## 2020-08-15 ENCOUNTER — Ambulatory Visit: Payer: PPO

## 2020-08-19 ENCOUNTER — Ambulatory Visit: Payer: PPO

## 2020-08-29 ENCOUNTER — Ambulatory Visit
Admission: RE | Admit: 2020-08-29 | Discharge: 2020-08-29 | Disposition: A | Discharge status: Home / Self Care | Payer: PPO | Source: Ambulatory Visit | Attending: Surgery | Admitting: Surgery

## 2020-08-29 ENCOUNTER — Other Ambulatory Visit: Payer: Self-pay

## 2020-08-29 DIAGNOSIS — M1612 Unilateral primary osteoarthritis, left hip: Secondary | ICD-10-CM | POA: Diagnosis not present

## 2020-08-29 DIAGNOSIS — M47816 Spondylosis without myelopathy or radiculopathy, lumbar region: Secondary | ICD-10-CM | POA: Insufficient documentation

## 2020-08-29 DIAGNOSIS — M5440 Lumbago with sciatica, unspecified side: Secondary | ICD-10-CM | POA: Diagnosis not present

## 2020-08-29 DIAGNOSIS — M7602 Gluteal tendinitis, left hip: Secondary | ICD-10-CM | POA: Diagnosis not present

## 2020-08-29 DIAGNOSIS — Z90710 Acquired absence of both cervix and uterus: Secondary | ICD-10-CM | POA: Diagnosis not present

## 2020-08-29 DIAGNOSIS — M7062 Trochanteric bursitis, left hip: Secondary | ICD-10-CM | POA: Insufficient documentation

## 2020-08-29 DIAGNOSIS — M545 Low back pain, unspecified: Secondary | ICD-10-CM | POA: Diagnosis not present

## 2020-08-29 DIAGNOSIS — G8929 Other chronic pain: Secondary | ICD-10-CM | POA: Insufficient documentation

## 2020-08-29 DIAGNOSIS — S73192A Other sprain of left hip, initial encounter: Secondary | ICD-10-CM | POA: Diagnosis not present

## 2020-08-29 DIAGNOSIS — M79605 Pain in left leg: Secondary | ICD-10-CM | POA: Diagnosis not present

## 2020-09-03 DIAGNOSIS — M1712 Unilateral primary osteoarthritis, left knee: Secondary | ICD-10-CM | POA: Diagnosis not present

## 2020-09-03 DIAGNOSIS — M1612 Unilateral primary osteoarthritis, left hip: Secondary | ICD-10-CM | POA: Diagnosis not present

## 2020-09-03 DIAGNOSIS — M47816 Spondylosis without myelopathy or radiculopathy, lumbar region: Secondary | ICD-10-CM | POA: Diagnosis not present

## 2020-09-09 DIAGNOSIS — M1612 Unilateral primary osteoarthritis, left hip: Secondary | ICD-10-CM | POA: Diagnosis not present

## 2020-09-09 DIAGNOSIS — M25552 Pain in left hip: Secondary | ICD-10-CM | POA: Diagnosis not present

## 2020-10-15 DIAGNOSIS — M7602 Gluteal tendinitis, left hip: Secondary | ICD-10-CM | POA: Diagnosis not present

## 2020-10-15 DIAGNOSIS — M1612 Unilateral primary osteoarthritis, left hip: Secondary | ICD-10-CM | POA: Diagnosis not present

## 2020-10-15 DIAGNOSIS — M7072 Other bursitis of hip, left hip: Secondary | ICD-10-CM | POA: Diagnosis not present

## 2020-11-01 IMAGING — MR MR KNEE*L* W/O CM
6 series · 40 of 40 positions shown · non-contrast
Comparison: Plain films left knee 06/03/2015.

CLINICAL DATA: Anterior and medial left knee pain for 2-3 years,
worsening. No known injury.

EXAM:
MRI OF THE LEFT KNEE WITHOUT CONTRAST
TECHNIQUE: Multiplanar, multisequence MR imaging of the knee was performed. No
intravenous contrast was administered.

[Series 3: T2 fat-sat · axial · 4.0mm · 0.53mm/px · z∈[-78,+62]mm · 5 of 29 slices shown (1 of 3)]
[im 1/29]
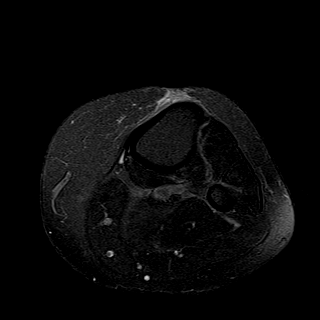
[im 8/29]
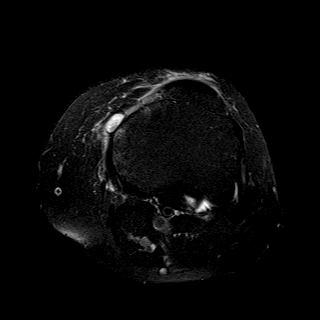
[im 15/29]
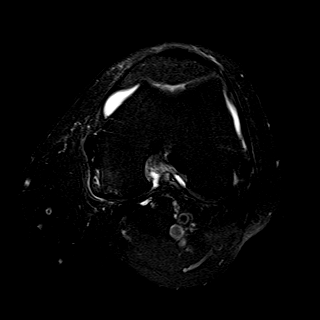
[im 22/29]
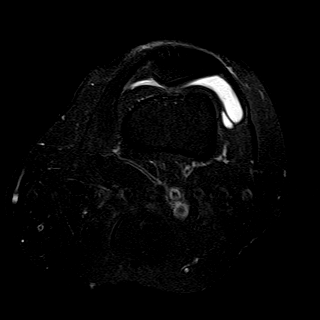
[im 29/29]
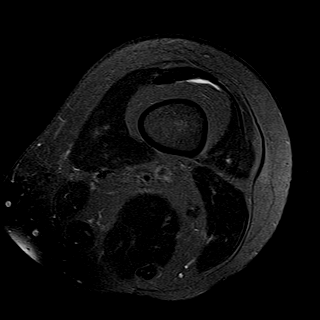

[Series 4: T1 · coronal · 4.0mm · 0.62mm/px · 7 of 31 slices shown]
[im 1/31]
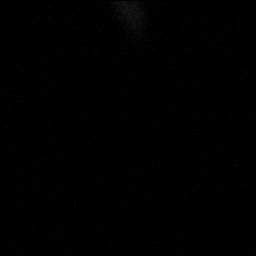
[im 6/31]
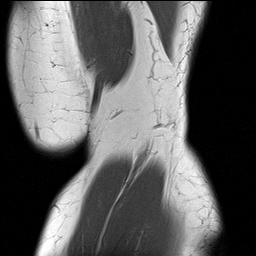
[im 11/31]
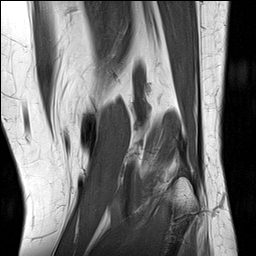
[im 16/31]
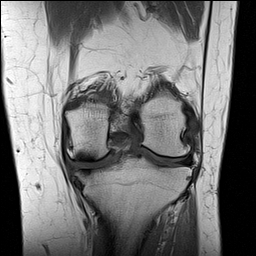
[im 21/31]
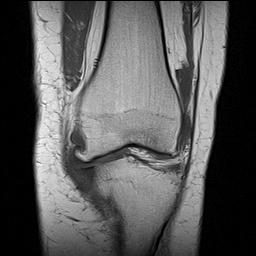
[im 26/31]
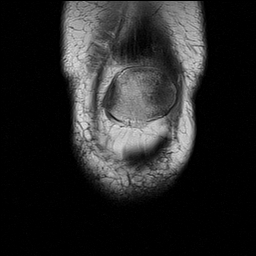
[im 31/31]
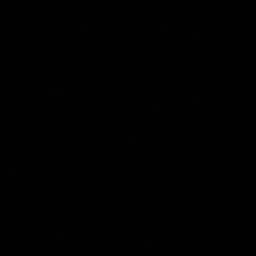

[Series 5: T2 fat-sat · coronal · 4.0mm · 0.62mm/px · 7 of 31 slices shown (2 of 3)]
[im 1/31]
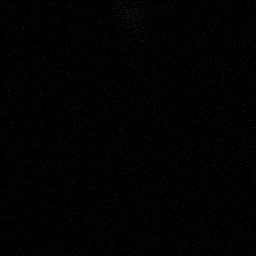
[im 6/31]
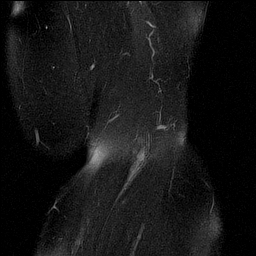
[im 11/31]
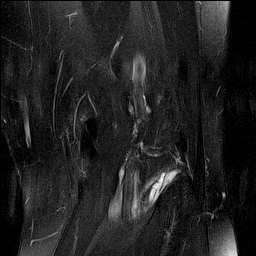
[im 16/31]
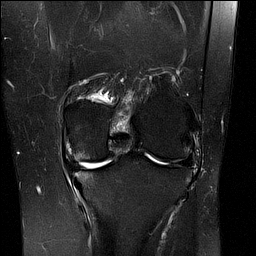
[im 21/31]
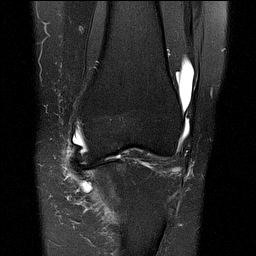
[im 26/31]
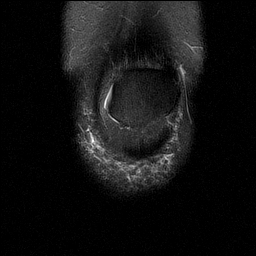
[im 31/31]
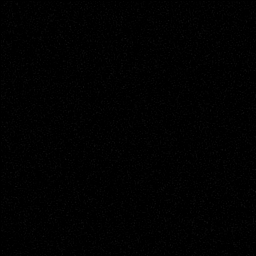

[Series 6: PD fat-sat · coronal · 4.0mm · 0.62mm/px · 7 of 31 slices shown (1 of 2)]
[im 1/31]
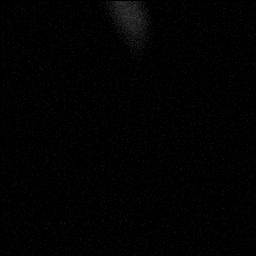
[im 6/31]
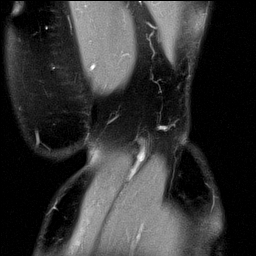
[im 11/31]
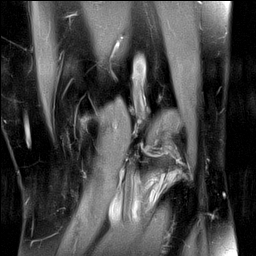
[im 16/31]
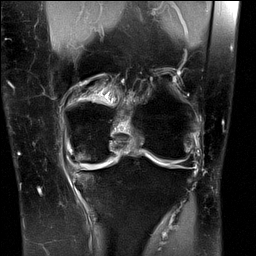
[im 21/31]
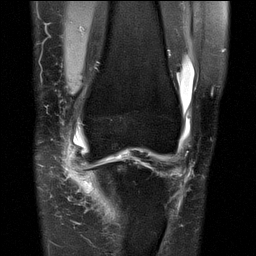
[im 26/31]
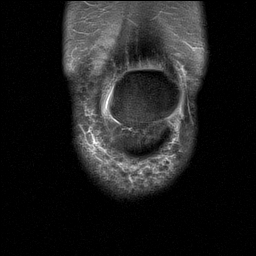
[im 31/31]
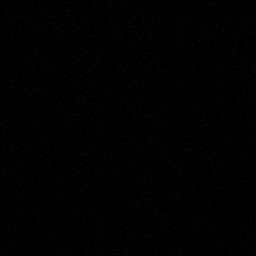

[Series 7: PD fat-sat · sagittal · 3.0mm · 0.62mm/px · 7 of 32 slices shown (2 of 2)]
[im 1/32]
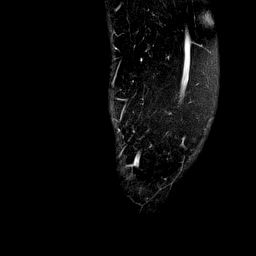
[im 6/32]
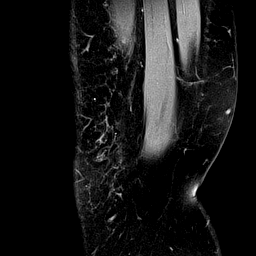
[im 11/32]
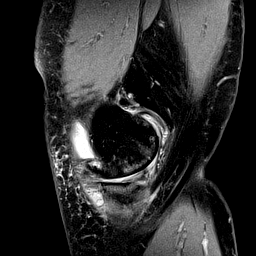
[im 16/32]
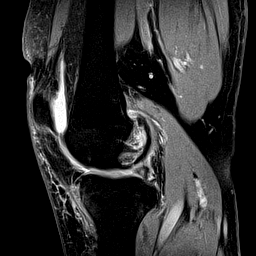
[im 21/32]
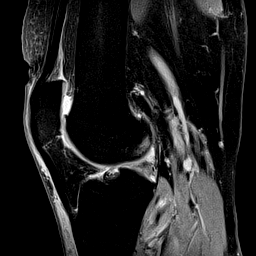
[im 26/32]
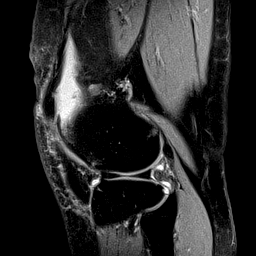
[im 32/32]
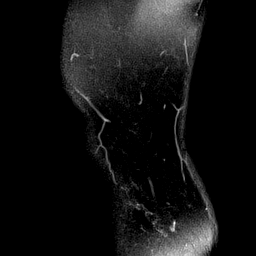

[Series 8: T2 fat-sat · sagittal · 3.0mm · 0.62mm/px · 7 of 32 slices shown (3 of 3)]
[im 1/32]
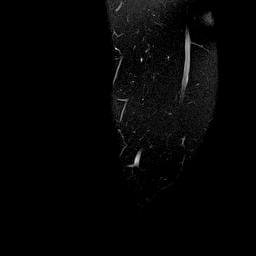
[im 6/32]
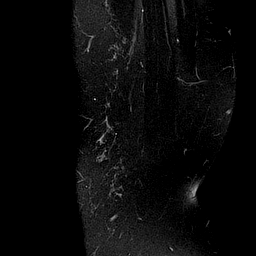
[im 11/32]
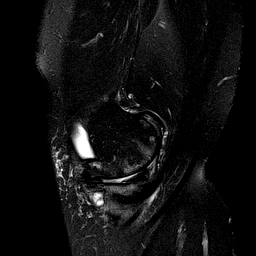
[im 16/32]
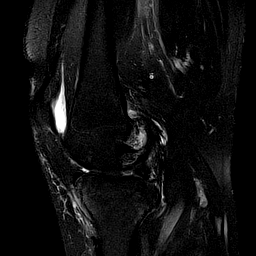
[im 21/32]
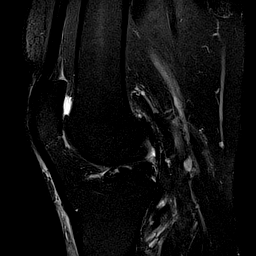
[im 26/32]
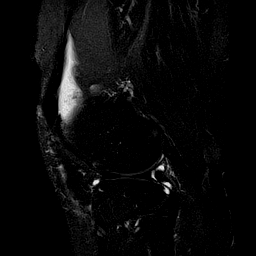
[im 32/32]
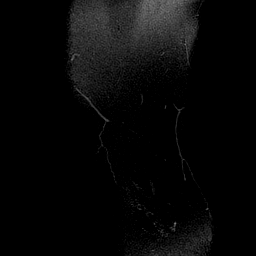

[40 of 40 positions shown; findings below may reference images not displayed]

FINDINGS: MENISCI

Medial meniscus: The posterior horn is markedly degenerated and
diminutive. Complex degenerative tearing is seen throughout the
body. The anterior horn is extruded anteriorly out of the joint.

Lateral meniscus:  Intact.

LIGAMENTS

Cruciates:  Intact.

Collaterals:  Intact.

CARTILAGE

Patellofemoral: Cartilage loss is present diffusely about the
patella and central femoral trochlea.

Medial:  Severe cartilage loss is present throughout.

Lateral:  Mildly degenerated.

Joint:  Small joint effusion.

Popliteal Fossa:  No Baker's cyst.

Extensor Mechanism:  Intact.

Bones: Tricompartmental osteophytosis is worst medially. Scattered
subchondral edema is seen about the medial compartment. No fracture
or worrisome lesion.

Other: None.
IMPRESSION: Dominant finding is osteoarthritis about the knee which is most
severe in the medial compartment.

Complex degenerative tearing throughout the body of the medial
meniscus. The posterior horn is markedly degenerated and irregular
and the anterior horn is extruded anteriorly out of the joint.

## 2020-11-12 DIAGNOSIS — G4700 Insomnia, unspecified: Secondary | ICD-10-CM | POA: Diagnosis not present

## 2020-11-12 DIAGNOSIS — R55 Syncope and collapse: Secondary | ICD-10-CM | POA: Diagnosis not present

## 2020-12-31 DIAGNOSIS — M1612 Unilateral primary osteoarthritis, left hip: Secondary | ICD-10-CM | POA: Diagnosis not present

## 2020-12-31 DIAGNOSIS — M7602 Gluteal tendinitis, left hip: Secondary | ICD-10-CM | POA: Diagnosis not present

## 2021-01-27 DIAGNOSIS — M129 Arthropathy, unspecified: Secondary | ICD-10-CM | POA: Diagnosis not present

## 2021-01-27 DIAGNOSIS — Z9989 Dependence on other enabling machines and devices: Secondary | ICD-10-CM | POA: Diagnosis not present

## 2021-01-27 DIAGNOSIS — I10 Essential (primary) hypertension: Secondary | ICD-10-CM | POA: Diagnosis not present

## 2021-01-27 DIAGNOSIS — G4700 Insomnia, unspecified: Secondary | ICD-10-CM | POA: Diagnosis not present

## 2021-01-27 DIAGNOSIS — Z0000 Encounter for general adult medical examination without abnormal findings: Secondary | ICD-10-CM | POA: Diagnosis not present

## 2021-01-27 DIAGNOSIS — E782 Mixed hyperlipidemia: Secondary | ICD-10-CM | POA: Diagnosis not present

## 2021-01-27 DIAGNOSIS — G4733 Obstructive sleep apnea (adult) (pediatric): Secondary | ICD-10-CM | POA: Diagnosis not present

## 2021-01-27 DIAGNOSIS — R7302 Impaired glucose tolerance (oral): Secondary | ICD-10-CM | POA: Diagnosis not present

## 2021-01-27 DIAGNOSIS — N1831 Chronic kidney disease, stage 3a: Secondary | ICD-10-CM | POA: Diagnosis not present

## 2021-01-28 DIAGNOSIS — E782 Mixed hyperlipidemia: Secondary | ICD-10-CM | POA: Diagnosis not present

## 2021-01-28 DIAGNOSIS — R7302 Impaired glucose tolerance (oral): Secondary | ICD-10-CM | POA: Diagnosis not present

## 2021-04-24 DIAGNOSIS — N1831 Chronic kidney disease, stage 3a: Secondary | ICD-10-CM | POA: Diagnosis not present

## 2021-04-24 DIAGNOSIS — R21 Rash and other nonspecific skin eruption: Secondary | ICD-10-CM | POA: Diagnosis not present

## 2021-04-24 DIAGNOSIS — F5101 Primary insomnia: Secondary | ICD-10-CM | POA: Diagnosis not present

## 2021-08-17 DIAGNOSIS — M129 Arthropathy, unspecified: Secondary | ICD-10-CM | POA: Diagnosis not present

## 2021-08-17 DIAGNOSIS — R7302 Impaired glucose tolerance (oral): Secondary | ICD-10-CM | POA: Diagnosis not present

## 2021-08-17 DIAGNOSIS — Z9989 Dependence on other enabling machines and devices: Secondary | ICD-10-CM | POA: Diagnosis not present

## 2021-08-17 DIAGNOSIS — F5101 Primary insomnia: Secondary | ICD-10-CM | POA: Diagnosis not present

## 2021-08-17 DIAGNOSIS — N1831 Chronic kidney disease, stage 3a: Secondary | ICD-10-CM | POA: Diagnosis not present

## 2021-08-17 DIAGNOSIS — I10 Essential (primary) hypertension: Secondary | ICD-10-CM | POA: Diagnosis not present

## 2021-08-17 DIAGNOSIS — E782 Mixed hyperlipidemia: Secondary | ICD-10-CM | POA: Diagnosis not present

## 2021-08-17 DIAGNOSIS — R21 Rash and other nonspecific skin eruption: Secondary | ICD-10-CM | POA: Diagnosis not present

## 2021-08-17 DIAGNOSIS — G4733 Obstructive sleep apnea (adult) (pediatric): Secondary | ICD-10-CM | POA: Diagnosis not present

## 2021-08-27 ENCOUNTER — Other Ambulatory Visit: Payer: Self-pay

## 2021-08-27 ENCOUNTER — Ambulatory Visit: Payer: PPO | Admitting: Dermatology

## 2021-08-27 ENCOUNTER — Encounter: Payer: Self-pay | Admitting: Dermatology

## 2021-08-27 DIAGNOSIS — R21 Rash and other nonspecific skin eruption: Secondary | ICD-10-CM | POA: Diagnosis not present

## 2021-08-27 DIAGNOSIS — L409 Psoriasis, unspecified: Secondary | ICD-10-CM | POA: Diagnosis not present

## 2021-08-27 MED ORDER — FLUOCINONIDE 0.05 % EX SOLN: 1.0000 "application " | Freq: Every day | CUTANEOUS | 1 refills | Status: AC

## 2021-08-27 MED ORDER — CLOBETASOL PROPIONATE 0.05 % EX FOAM
Freq: Every day | CUTANEOUS | 2 refills | Status: DC
Start: 2021-08-27 — End: 2021-08-28

## 2021-08-27 NOTE — Patient Instructions (Addendum)
Psoriasis is a chronic non-curable, but treatable genetic/hereditary disease that may have other systemic features affecting other organ systems such as joints (Psoriatic Arthritis). It is associated with an increased risk of inflammatory bowel disease, heart disease, non-alcoholic fatty liver disease, and depression.    Start clobetasol 0.05% foam once daily as needed to scalp. Avoid applying to face, groin, and axilla. Use as directed. Long-term use can cause thinning of the skin.  Topical steroids (such as triamcinolone, fluocinolone, fluocinonide, mometasone, clobetasol, halobetasol, betamethasone, hydrocortisone) can cause thinning and lightening of the skin if they are used for too long in the same area. Your physician has selected the right strength medicine for your problem and area affected on the body. Please use your medication only as directed by your physician to prevent side effects.   If not clearing with clobetasol foam may try T-Sal shampoo 2-3 times weekly.   Recommend daily broad spectrum sunscreen SPF 30+ to sun-exposed areas, reapply every 2 hours as needed. Call for new or changing lesions.  Staying in the shade or wearing long sleeves, sun glasses (UVA+UVB protection) and wide brim hats (4-inch brim around the entire circumference of the hat) are also recommended for sun protection.   Recommend taking Heliocare sun protection supplement daily in sunny weather for additional sun protection. For maximum protection on the sunniest days, you can take up to 2 capsules of regular Heliocare OR take 1 capsule of Heliocare Ultra. For prolonged exposure (such as a full day in the sun), you can repeat your dose of the supplement 4 hours after your first dose. Heliocare can be purchased at [employer], at some [facility] or at [email or web address].    If You Need Anything After Your Visit  If you have any questions or concerns for your doctor, please call our main line at [phone]  and press option 4 to reach your doctor's medical assistant. If no one answers, please leave a voicemail as directed and we will return your call as soon as possible. Messages left after 4 pm will be answered the following business day.   You may also send [place] a message via MyChart. We typically respond to MyChart messages within 1-2 business days.  For prescription refills, please ask your pharmacy to contact our office. Our fax number is [phone].  If you have an urgent issue when the clinic is closed that cannot wait until the next business day, you can page your doctor at the number below.    Please note that while we do our best to be available for urgent issues outside of office hours, we are not available 24/7.   If you have an urgent issue and are unable to reach [place], you may choose to seek medical care at your doctor's office, retail clinic, urgent care center, or emergency room.  If you have a medical emergency, please immediately call 911 or go to the emergency department.  Pager Numbers  - Dr. Gwen Pounds: [phone]  - Dr. Neale Burly: [phone]  - Dr. Roseanne Reno: [phone]  In the event of inclement weather, please call our main line at [phone] for an update on the status of any delays or closures.  Dermatology Medication Tips: Please keep the boxes that topical medications come in in order to help keep track of the instructions about where and how to use these. Pharmacies typically print the medication instructions only on the boxes and not directly on the medication tubes.   If your medication is too expensive, please contact  our office at [phone] option 4 or send [place] a message through MyChart.   We are unable to tell what your co-pay for medications will be in advance as this is different depending on your insurance coverage. However, we may be able to find a substitute medication at lower cost or fill out paperwork to get insurance to cover a needed medication.    If a prior authorization is required to get your medication covered by your insurance company, please allow [place] 1-2 business days to complete this process.  Drug prices often vary depending on where the prescription is filled and some pharmacies may offer cheaper prices.  The website www.goodrx.com contains coupons for medications through different pharmacies. The prices here do not account for what the cost may be with help from insurance (it may be cheaper with your insurance), but the website can give you the price if you did not use any insurance.  - You can print the associated coupon and take it with your prescription to the pharmacy.  - You may also stop by our office during regular business hours and pick up a GoodRx coupon card.  - If you need your prescription sent electronically to a different pharmacy, notify our office through [facility] or by phone at [phone] option 4.     Si Usted Necesita Algo Despus de Su Visita  Tambin puede enviarnos un mensaje a travs de Clinical cytogeneticist. Por lo general respondemos a los mensajes de MyChart en el transcurso de 1 a 2 das hbiles.  Para renovar recetas, por favor pida a su farmacia que se ponga en contacto con nuestra oficina. Annie Sable de fax es [place] [phone].  Si tiene un asunto urgente cuando la clnica est cerrada y que no puede esperar hasta el siguiente da hbil, puede llamar/localizar a su doctor(a) al nmero que aparece a continuacin.   Por favor, tenga en cuenta que aunque hacemos todo lo posible para estar disponibles para asuntos urgentes fuera del horario de [place], no estamos disponibles las 24 horas del da, los 7 [address]  [address] de la [place].   Si tiene un problema urgente y no puede comunicarse con nosotros, puede optar por buscar atencin mdica  en el consultorio de su doctor(a), en una clnica privada, en un centro de atencin urgente o en una sala de emergencias.  Si tiene Engineer, drilling, por favor  llame inmediatamente al 911 o vaya a la sala de emergencias.  Nmeros de bper  - Dr. Gwen Pounds: [phone]  - Dra. Moye: [phone]  - Dra. Roseanne Reno: [phone]  En caso de inclemencias del [place], por favor llame a Lacy Duverney principal al [phone] para una actualizacin sobre el [place] de cualquier retraso o cierre.  Consejos para la medicacin en dermatologa: Por favor, guarde las cajas en las que vienen los medicamentos de uso tpico para ayudarle a seguir las instrucciones sobre dnde y cmo usarlos. Las farmacias generalmente imprimen las instrucciones del medicamento slo en las cajas y no directamente en los tubos del [place].   Si su medicamento es muy caro, por favor, pngase en contacto con Rolm Gala llamando al [phone] y presione la opcin 4 o envenos un mensaje a travs de Clinical cytogeneticist.   No podemos decirle cul ser su copago por los medicamentos por adelantado ya que esto es diferente dependiendo de la cobertura de su seguro. Sin embargo, es posible que podamos encontrar un medicamento sustituto a Audiological scientist un formulario para que el seguro cubra el medicamento  que se considera necesario.   Si se requiere una autorizacin previa para que su compaa de seguros [place] su medicamento, por favor permtanos de 1 a 2 das hbiles para completar [address].  Los precios de los medicamentos varan con frecuencia dependiendo del Environmental consultantlugar de dnde se surte la receta y alguna farmacias pueden ofrecer precios ms baratos.  El sitio web www.goodrx.com tiene cupones para medicamentos de Health and safety inspectordiferentes farmacias. Los precios aqu no tienen en cuenta lo que podra costar con la ayuda del seguro (puede ser ms barato con su seguro), pero el sitio web puede darle el precio si no utiliz Tourist information centre managerningn seguro.  - Puede imprimir el cupn correspondiente y llevarlo con su receta a la farmacia.  - Tambin puede pasar por nuestra oficina durante el horario de atencin regular y  Education officer, museumrecoger una tarjeta de cupones de GoodRx.  - Si necesita que su receta se enve electrnicamente a una farmacia diferente, informe a nuestra oficina a travs de MyChart de [place] o por telfono llamando al [phone] y presione la opcin 4.

## 2021-08-27 NOTE — Progress Notes (Signed)
New Patient Visit  Subjective  Kaitlyn Sanders is a 76 y.o. female who presents for the following: Rash (New patient here today for itchy rash at scalp that started out as two small spots > 6 months ago. Patient's PCP advised patient to use Selsun Blue shampoo. No fhx psoriasis. Patient with no hx of skin cancer, no fhx skin cancer. ).  The following portions of the chart were reviewed this encounter and updated as appropriate:   Tobacco   Allergies   Meds   Problems   Med Hx   Surg Hx   Fam Hx       Review of Systems:  No other skin or systemic complaints except as noted in HPI or Assessment and Plan.  Objective  Well appearing patient in no apparent distress; mood and affect are within normal limits.  A focused examination was performed including scalp, hands. Relevant physical exam findings are noted in the Assessment and Plan.  Scalp Well demarcated scaly red plaques at occipital scalp    Assessment & Plan  Psoriasis Scalp  Psoriasis is a chronic non-curable, but treatable genetic/hereditary disease that may have other systemic features affecting other organ systems such as joints (Psoriatic Arthritis). It is associated with an increased risk of inflammatory bowel disease, heart disease, non-alcoholic fatty liver disease, and depression.    Patient does have arthritis, she notices generally in the evening. In the morning it lasts just a few minutes. Not suggestive of psoriatic arthritis at this time.  Start clobetasol 0.05% foam once daily as needed to scalp. Avoid applying to face, groin, and axilla. Use as directed. Long-term use can cause thinning of the skin.  If not clearing with clobetasol foam may try adding T-Sal shampoo 2-3 times weekly.   Topical steroids (such as triamcinolone, fluocinolone, fluocinonide, mometasone, clobetasol, halobetasol, betamethasone, hydrocortisone) can cause thinning and lightening of the skin if they are used for too long in the same  area. Your physician has selected the right strength medicine for your problem and area affected on the body. Please use your medication only as directed by your physician to prevent side effects.   Consider switching to Enstilar foam or Vtama if not clear using clobetasol within 6 weeks. Pt to call.   Patient will RTC sooner if spreading or developing joint pain.   clobetasol (OLUX) 0.05 % topical foam - Scalp Apply topically daily. As needed to scalp for itch and rash.  Rash Scalp  Start fluocinonide solution once daily as needed for rash at scalp. Avoid applying to face, groin, and axilla. Use as directed. Long-term use can cause thinning of the skin.  Topical steroids (such as triamcinolone, fluocinolone, fluocinonide, mometasone, clobetasol, halobetasol, betamethasone, hydrocortisone) can cause thinning and lightening of the skin if they are used for too long in the same area. Your physician has selected the right strength medicine for your problem and area affected on the body. Please use your medication only as directed by your physician to prevent side effects.    fluocinonide (LIDEX) 0.05 % external solution - Scalp Apply 1 application topically daily. Avoid applying to face, groin, and axilla. Use as directed. Long-term use can cause thinning of the skin.   Return in about 1 year (around 08/27/2022) for Psoriasis.  Graciella Belton, RMA, am acting as scribe for Forest Gleason, MD .  Documentation: I have reviewed the above documentation for accuracy and completeness, and I agree with the above.  Forest Gleason, MD

## 2021-08-28 MED ORDER — CLOBETASOL PROPIONATE 0.05 % EX CREA
TOPICAL_CREAM | CUTANEOUS | 0 refills | Status: AC
Start: 2021-08-28 — End: ?

## 2021-09-23 DIAGNOSIS — M25562 Pain in left knee: Secondary | ICD-10-CM | POA: Diagnosis not present

## 2021-09-23 DIAGNOSIS — Z96652 Presence of left artificial knee joint: Secondary | ICD-10-CM | POA: Diagnosis not present

## 2021-09-23 DIAGNOSIS — M1712 Unilateral primary osteoarthritis, left knee: Secondary | ICD-10-CM | POA: Diagnosis not present

## 2021-11-09 DIAGNOSIS — G4733 Obstructive sleep apnea (adult) (pediatric): Secondary | ICD-10-CM | POA: Diagnosis not present

## 2021-11-25 DIAGNOSIS — M7072 Other bursitis of hip, left hip: Secondary | ICD-10-CM | POA: Diagnosis not present

## 2021-11-25 DIAGNOSIS — M1612 Unilateral primary osteoarthritis, left hip: Secondary | ICD-10-CM | POA: Diagnosis not present

## 2021-11-30 DIAGNOSIS — M1612 Unilateral primary osteoarthritis, left hip: Secondary | ICD-10-CM | POA: Diagnosis not present

## 2021-12-09 DIAGNOSIS — G4733 Obstructive sleep apnea (adult) (pediatric): Secondary | ICD-10-CM | POA: Diagnosis not present

## 2022-01-09 DIAGNOSIS — G4733 Obstructive sleep apnea (adult) (pediatric): Secondary | ICD-10-CM | POA: Diagnosis not present

## 2022-02-08 DIAGNOSIS — G4733 Obstructive sleep apnea (adult) (pediatric): Secondary | ICD-10-CM | POA: Diagnosis not present

## 2022-03-10 DIAGNOSIS — M25552 Pain in left hip: Secondary | ICD-10-CM | POA: Diagnosis not present

## 2022-03-10 DIAGNOSIS — M47816 Spondylosis without myelopathy or radiculopathy, lumbar region: Secondary | ICD-10-CM | POA: Diagnosis not present

## 2022-03-10 DIAGNOSIS — G5702 Lesion of sciatic nerve, left lower limb: Secondary | ICD-10-CM | POA: Diagnosis not present

## 2022-03-10 DIAGNOSIS — M7602 Gluteal tendinitis, left hip: Secondary | ICD-10-CM | POA: Diagnosis not present

## 2022-03-11 DIAGNOSIS — G4733 Obstructive sleep apnea (adult) (pediatric): Secondary | ICD-10-CM | POA: Diagnosis not present

## 2022-03-12 DIAGNOSIS — I10 Essential (primary) hypertension: Secondary | ICD-10-CM | POA: Diagnosis not present

## 2022-03-12 DIAGNOSIS — Z9989 Dependence on other enabling machines and devices: Secondary | ICD-10-CM | POA: Diagnosis not present

## 2022-03-12 DIAGNOSIS — F5101 Primary insomnia: Secondary | ICD-10-CM | POA: Diagnosis not present

## 2022-03-12 DIAGNOSIS — H6121 Impacted cerumen, right ear: Secondary | ICD-10-CM | POA: Diagnosis not present

## 2022-03-12 DIAGNOSIS — G4733 Obstructive sleep apnea (adult) (pediatric): Secondary | ICD-10-CM | POA: Diagnosis not present

## 2022-03-12 DIAGNOSIS — M129 Arthropathy, unspecified: Secondary | ICD-10-CM | POA: Diagnosis not present

## 2022-03-12 DIAGNOSIS — R7302 Impaired glucose tolerance (oral): Secondary | ICD-10-CM | POA: Diagnosis not present

## 2022-03-12 DIAGNOSIS — Z0000 Encounter for general adult medical examination without abnormal findings: Secondary | ICD-10-CM | POA: Diagnosis not present

## 2022-03-12 DIAGNOSIS — N1831 Chronic kidney disease, stage 3a: Secondary | ICD-10-CM | POA: Diagnosis not present

## 2022-03-12 DIAGNOSIS — E782 Mixed hyperlipidemia: Secondary | ICD-10-CM | POA: Diagnosis not present

## 2022-03-16 DIAGNOSIS — E782 Mixed hyperlipidemia: Secondary | ICD-10-CM | POA: Diagnosis not present

## 2022-03-16 DIAGNOSIS — R7302 Impaired glucose tolerance (oral): Secondary | ICD-10-CM | POA: Diagnosis not present

## 2022-03-24 DIAGNOSIS — M129 Arthropathy, unspecified: Secondary | ICD-10-CM | POA: Diagnosis not present

## 2022-03-24 DIAGNOSIS — I10 Essential (primary) hypertension: Secondary | ICD-10-CM | POA: Diagnosis not present

## 2022-03-24 DIAGNOSIS — G4733 Obstructive sleep apnea (adult) (pediatric): Secondary | ICD-10-CM | POA: Diagnosis not present

## 2022-03-24 DIAGNOSIS — R7302 Impaired glucose tolerance (oral): Secondary | ICD-10-CM | POA: Diagnosis not present

## 2022-03-24 DIAGNOSIS — E782 Mixed hyperlipidemia: Secondary | ICD-10-CM | POA: Diagnosis not present

## 2022-03-24 DIAGNOSIS — F5101 Primary insomnia: Secondary | ICD-10-CM | POA: Diagnosis not present

## 2022-03-24 DIAGNOSIS — N1831 Chronic kidney disease, stage 3a: Secondary | ICD-10-CM | POA: Diagnosis not present

## 2022-03-29 DIAGNOSIS — M25552 Pain in left hip: Secondary | ICD-10-CM | POA: Diagnosis not present

## 2022-03-29 DIAGNOSIS — M1612 Unilateral primary osteoarthritis, left hip: Secondary | ICD-10-CM | POA: Diagnosis not present

## 2022-04-11 DIAGNOSIS — G4733 Obstructive sleep apnea (adult) (pediatric): Secondary | ICD-10-CM | POA: Diagnosis not present

## 2022-04-28 DIAGNOSIS — F5101 Primary insomnia: Secondary | ICD-10-CM | POA: Diagnosis not present

## 2022-04-28 DIAGNOSIS — I10 Essential (primary) hypertension: Secondary | ICD-10-CM | POA: Diagnosis not present

## 2022-04-28 DIAGNOSIS — R7302 Impaired glucose tolerance (oral): Secondary | ICD-10-CM | POA: Diagnosis not present

## 2022-04-28 DIAGNOSIS — N1831 Chronic kidney disease, stage 3a: Secondary | ICD-10-CM | POA: Diagnosis not present

## 2022-04-28 DIAGNOSIS — M129 Arthropathy, unspecified: Secondary | ICD-10-CM | POA: Diagnosis not present

## 2022-05-05 DIAGNOSIS — Z23 Encounter for immunization: Secondary | ICD-10-CM | POA: Diagnosis not present

## 2022-05-06 DIAGNOSIS — R7302 Impaired glucose tolerance (oral): Secondary | ICD-10-CM | POA: Diagnosis not present

## 2022-05-06 DIAGNOSIS — M129 Arthropathy, unspecified: Secondary | ICD-10-CM | POA: Diagnosis not present

## 2022-05-06 DIAGNOSIS — I10 Essential (primary) hypertension: Secondary | ICD-10-CM | POA: Diagnosis not present

## 2022-05-06 DIAGNOSIS — E782 Mixed hyperlipidemia: Secondary | ICD-10-CM | POA: Diagnosis not present

## 2022-05-06 DIAGNOSIS — N1831 Chronic kidney disease, stage 3a: Secondary | ICD-10-CM | POA: Diagnosis not present

## 2022-05-11 DIAGNOSIS — G4733 Obstructive sleep apnea (adult) (pediatric): Secondary | ICD-10-CM | POA: Diagnosis not present

## 2022-05-31 ENCOUNTER — Encounter (INDEPENDENT_AMBULATORY_CARE_PROVIDER_SITE_OTHER): Payer: Self-pay

## 2022-06-09 DIAGNOSIS — M1712 Unilateral primary osteoarthritis, left knee: Secondary | ICD-10-CM | POA: Diagnosis not present

## 2022-06-09 DIAGNOSIS — M47816 Spondylosis without myelopathy or radiculopathy, lumbar region: Secondary | ICD-10-CM | POA: Diagnosis not present

## 2022-06-09 DIAGNOSIS — Z96652 Presence of left artificial knee joint: Secondary | ICD-10-CM | POA: Diagnosis not present

## 2022-06-09 DIAGNOSIS — M25561 Pain in right knee: Secondary | ICD-10-CM | POA: Diagnosis not present

## 2022-06-11 DIAGNOSIS — G4733 Obstructive sleep apnea (adult) (pediatric): Secondary | ICD-10-CM | POA: Diagnosis not present

## 2022-06-20 ENCOUNTER — Ambulatory Visit
Admission: EM | Admit: 2022-06-20 | Discharge: 2022-06-20 | Disposition: A | Discharge status: Home / Self Care | Payer: PPO | Attending: Family Medicine | Admitting: Family Medicine

## 2022-06-20 DIAGNOSIS — J40 Bronchitis, not specified as acute or chronic: Secondary | ICD-10-CM

## 2022-06-20 DIAGNOSIS — F17200 Nicotine dependence, unspecified, uncomplicated: Secondary | ICD-10-CM

## 2022-06-20 MED ORDER — DOXYCYCLINE HYCLATE 100 MG PO CAPS: 100.0000 mg | ORAL_CAPSULE | Freq: Two times a day (BID) | ORAL | 0 refills | Status: AC

## 2022-06-20 MED ORDER — BENZONATATE 200 MG PO CAPS: 200.0000 mg | ORAL_CAPSULE | Freq: Three times a day (TID) | ORAL | 0 refills | Status: AC | PRN

## 2022-06-20 MED ORDER — ALBUTEROL SULFATE HFA 108 (90 BASE) MCG/ACT IN AERS: 4.0000 | INHALATION_SPRAY | Freq: Four times a day (QID) | RESPIRATORY_TRACT | 0 refills | Status: AC | PRN

## 2022-06-20 MED ORDER — PREDNISONE 50 MG PO TABS: ORAL_TABLET | ORAL | 0 refills | Status: AC

## 2022-06-20 NOTE — Discharge Instructions (Addendum)
You likely have a infection in your bronchitis.  Stop by the pharmacy to pick up your prescription steroids and antibiotics.  Follow up with your primary care provider as needed.

## 2022-06-20 NOTE — ED Provider Notes (Signed)
[facility]    CSN: [id number] Arrival date & time: 06/20/22  5176      History   Chief Complaint Chief Complaint  Patient presents with   Sore Throat   Cough   Nasal Congestion   Chills    HPI Kaitlyn Sanders is a 76 y.o. female.   HPI   Kaitlyn Sanders presents for cough. Since Tuesday she has been taking Tylenol for her symptoms. Last night she woke up in the middle of the night coughing hard.  She took a COVID test that was negative. Has headache, cough, chest discomfort, chills and fatigue.  Denies fever, shortness of breath., vomiting or diarrhea. She is a  smoker.       Past Medical History:  Diagnosis Date   Arthritis    Dyspnea    Hypertension    Sleep apnea     Patient Active Problem List   Diagnosis Date Noted   Essential hypertension 02/12/2020   Hyperlipidemia 02/12/2020   Pain in limb 02/12/2020   Osteopenia of spine 06/08/2019   Situational depression 06/08/2019   Acute neck pain 06/07/2019   Psychophysiological insomnia 06/07/2019   Status post left partial knee replacement 01/04/2019   Primary osteoarthritis of first carpometacarpal joint of right hand 03/30/2017   Primary osteoarthritis of left knee 03/30/2017   Spondylosis of lumbar region without myelopathy or radiculopathy 07/08/2015   Obstructive sleep apnea (adult) (pediatric) 06/16/2015    Past Surgical History:  Procedure Laterality Date   ABDOMINAL HYSTERECTOMY     CHOLECYSTECTOMY     KNEE ARTHROSCOPY Right 2007   PARTIAL KNEE ARTHROPLASTY Left 01/04/2019   Procedure: UNICOMPARTMENTAL KNEE LEFT;  Surgeon: Christena Flake, MD;  Location: [facility] ORS;  Service: Orthopedics;  Laterality: Left;    OB History   No obstetric history on file.      Home Medications    Prior to Admission medications   Medication Sig Start Date End Date Taking? Authorizing Provider  atorvastatin (LIPITOR) 10 MG tablet Take 10 mg by mouth daily. 05/22/19  Yes [provider]  ascorbic  acid (VITAMIN C) 500 MG tablet Take 500 mg by mouth daily.    [provider]  benzonatate (TESSALON) 200 MG capsule Take 1 capsule (200 mg total) by mouth 3 (three) times daily as needed for cough. Patient not taking: Reported on 08/27/2021 06/24/20   Tommie Sams, DO  cholecalciferol (VITAMIN D3) 25 MCG (1000 UNIT) tablet Take 1,000 Units by mouth daily.    [provider]  clobetasol cream (TEMOVATE) 0.05 % Apply to itchy rash on scalp QD PRN. Avoid applying to face, groin, and axilla. Use as directed. Long-term use can cause thinning of the skin. 08/28/21   Moye, [place], MD  doxycycline (VIBRAMYCIN) 100 MG capsule Take 1 capsule (100 mg total) by mouth 2 (two) times daily. Patient not taking: Reported on 08/27/2021 06/24/20   Tommie Sams, DO  fluocinonide (LIDEX) 0.05 % external solution Apply 1 application topically daily. Avoid applying to face, groin, and axilla. Use as directed. Long-term use can cause thinning of the skin. 08/27/21   Moye, [place], MD  gabapentin (NEURONTIN) 100 MG capsule Take 100 mg by mouth 3 (three) times daily.  05/15/19   [provider]  loratadine (CLARITIN) 10 MG tablet Take 10 mg by mouth daily. Liquigel    [provider]  Multiple Vitamin (MULTIVITAMIN WITH MINERALS) TABS tablet Take 1 tablet by mouth daily. Centrum Silver    [provider]  predniSONE (DELTASONE) 50 MG tablet 1 tablet daily x 5 days Patient not taking: Reported on 08/27/2021 06/24/20   Tommie Sams, DO  traZODone (DESYREL) 100 MG tablet Take 1 tablet (100 mg total) by mouth at bedtime as needed for sleep. Patient not taking: Reported on 08/27/2021 07/18/18   Tommie Sams, DO  apixaban (ELIQUIS) 2.5 MG TABS tablet Take 1 tablet (2.5 mg total) by mouth 2 (two) times daily. 01/05/19 05/31/19  Anson [place], PA-C  buPROPion (WELLBUTRIN XL) 150 MG 24 hr tablet Take 150 mg by mouth daily. 08/28/18 05/31/19  [provider]  ipratropium  (ATROVENT) 0.06 % nasal spray Place 2 sprays into both nostrils 4 (four) times daily as needed for rhinitis. Patient not taking: Reported on 02/12/2020 07/18/18 06/24/20  Tommie Sams, DO  valsartan (DIOVAN) 80 MG tablet Take 80 mg by mouth daily. Patient not taking: Reported on 02/12/2020  06/24/20  [provider]    Family History Family History  Problem Relation Age of Onset   Cancer Mother    Deep vein thrombosis Sister     Social History Social History   Tobacco Use   Smoking status: Former    Packs/day: 0.25    Years: 64.00    Total pack years: 16.00    Types: Cigarettes    Quit date: 01/20/2019    Years since quitting: 3.4   Smokeless tobacco: Never  Vaping Use   Vaping Use: Never used  Substance Use Topics   Alcohol use: No   Drug use: Not Currently     Allergies   Bee venom, Shrimp [shellfish allergy], and Contrast media [iodinated contrast media]   Review of Systems Review of Systems: negative unless otherwise stated in HPI.      Physical Exam Triage Vital Signs ED Triage Vitals  Enc Vitals Group     BP 06/20/22 1045 (!) 166/75     Pulse Rate 06/20/22 1045 85     Resp --      Temp 06/20/22 1045 97.9 F (36.6 C)     Temp Source 06/20/22 1045 Oral     SpO2 06/20/22 1045 99 %     Weight 06/20/22 1043 167 lb (75.8 kg)     Height 06/20/22 1043 5\' 3"  (1.6 m)     Head Circumference --      Peak Flow --      Pain Score 06/20/22 1043 5     Pain Loc --      Pain Edu? --      Excl. in GC? --    No data found.  Updated Vital Signs BP (!) 166/75 (BP Location: Left Arm)   Pulse 85   Temp 97.9 F (36.6 C) (Oral)   Ht 5\' 3"  (1.6 m)   Wt 75.8 kg   SpO2 99%   BMI 29.58 kg/m   Visual Acuity Right Eye Distance:   Left Eye Distance:   Bilateral Distance:    Right Eye Near:   Left Eye Near:    Bilateral Near:     Physical Exam GEN:     alert, non-toxic appearing female in no distress     HENT:  mucus membranes moist, oropharyngeal   without lesions or  exudate, no  tonsillar hypertrophy, mild oropharyngeal erythema, clear nasal discharge,  bilateral TM  normal EYES:   pupils equal and reactive, no scleral injection NECK:  normal ROM RESP:  no increased work of breathing,  clear  to auscultation bilaterally CVS:   regular rate  and rhythm Skin:   warm and dry, no rash on visible skin    UC Treatments / Results  Labs (all labs ordered are listed, but only abnormal results are displayed) Labs Reviewed - No data to display  EKG   Radiology No results found.  Procedures Procedures (including critical care time)  Medications Ordered in UC Medications - No data to display  Initial Impression / Assessment and Plan / UC Course  I have reviewed the triage vital signs and the nursing notes.  Pertinent labs & imaging results that were available during my care of the patient were reviewed by me and considered in my medical decision making (see chart for details).      COPDe   Pt is a 76 y.o. female who presents for *** days of respiratory symptoms. Valeri is ***afebrile here without recent antipyretics. Satting well on room air. Overall pt is ***well appearing, well hydrated, without respiratory distress. Pulmonary exam ***is unremarkable.  COVID ***and influenza testing obtained. ***Pt to quarantine until COVID test results or longer if positive.  I will call patient with test results, if positive. History consistent with ***viral respiratory illness. Discussed symptomatic treatment.  Explained lack of efficacy of antibiotics in viral disease.  Typical duration of symptoms discussed. ***Return and ED precautions given and patient/parent*** voiced understanding. - continue age-appropriate Tylenol/ ***Motrin as needed for discomfort/fever - nasal saline to help with his nasal congestion - Use a mist humidifier to help with breathing - Stressed importance of hydration  - Refilled ***allergy medication/ Flonase -  Albuterol to help with chest tightness ***  - Zofran for nausea *** - Work/***School note provided, per pt request  - Discussed return and ED precautions, understanding voiced.   Discussed MDM, treatment plan and plan for follow-up with patient/parent*** who agrees with plan.     Final Clinical Impressions(s) / UC Diagnoses   Final diagnoses:  None   Discharge Instructions   None    ED Prescriptions   None    PDMP not reviewed this encounter.

## 2022-06-20 NOTE — ED Triage Notes (Addendum)
Patient reports cough, congestion, sore throat, chills.  Patient is unsure of fevers.   At home covid test this AM  -- negative.   Patient is not wanting any more test at this time while in triage.

## 2022-07-07 DIAGNOSIS — Z716 Tobacco abuse counseling: Secondary | ICD-10-CM | POA: Diagnosis not present

## 2022-07-07 DIAGNOSIS — J40 Bronchitis, not specified as acute or chronic: Secondary | ICD-10-CM | POA: Diagnosis not present

## 2022-07-07 DIAGNOSIS — R059 Cough, unspecified: Secondary | ICD-10-CM | POA: Diagnosis not present

## 2022-07-11 DIAGNOSIS — G4733 Obstructive sleep apnea (adult) (pediatric): Secondary | ICD-10-CM | POA: Diagnosis not present

## 2022-08-02 DIAGNOSIS — G4733 Obstructive sleep apnea (adult) (pediatric): Secondary | ICD-10-CM | POA: Diagnosis not present

## 2022-08-11 DIAGNOSIS — G4733 Obstructive sleep apnea (adult) (pediatric): Secondary | ICD-10-CM | POA: Diagnosis not present

## 2022-09-02 ENCOUNTER — Ambulatory Visit: Payer: PPO | Admitting: Dermatology

## 2022-09-08 DIAGNOSIS — G4733 Obstructive sleep apnea (adult) (pediatric): Secondary | ICD-10-CM | POA: Diagnosis not present

## 2022-09-11 DIAGNOSIS — G4733 Obstructive sleep apnea (adult) (pediatric): Secondary | ICD-10-CM | POA: Diagnosis not present

## 2022-09-20 DIAGNOSIS — J40 Bronchitis, not specified as acute or chronic: Secondary | ICD-10-CM | POA: Diagnosis not present

## 2022-09-20 DIAGNOSIS — G4733 Obstructive sleep apnea (adult) (pediatric): Secondary | ICD-10-CM | POA: Diagnosis not present

## 2022-09-20 DIAGNOSIS — F17209 Nicotine dependence, unspecified, with unspecified nicotine-induced disorders: Secondary | ICD-10-CM | POA: Diagnosis not present

## 2022-09-20 DIAGNOSIS — E782 Mixed hyperlipidemia: Secondary | ICD-10-CM | POA: Diagnosis not present

## 2022-09-20 DIAGNOSIS — R053 Chronic cough: Secondary | ICD-10-CM | POA: Diagnosis not present

## 2022-09-20 DIAGNOSIS — R7302 Impaired glucose tolerance (oral): Secondary | ICD-10-CM | POA: Diagnosis not present

## 2022-09-20 DIAGNOSIS — N1831 Chronic kidney disease, stage 3a: Secondary | ICD-10-CM | POA: Diagnosis not present

## 2022-09-20 DIAGNOSIS — M129 Arthropathy, unspecified: Secondary | ICD-10-CM | POA: Diagnosis not present

## 2022-09-20 DIAGNOSIS — I10 Essential (primary) hypertension: Secondary | ICD-10-CM | POA: Diagnosis not present

## 2022-09-20 DIAGNOSIS — Z0000 Encounter for general adult medical examination without abnormal findings: Secondary | ICD-10-CM | POA: Diagnosis not present

## 2022-09-20 DIAGNOSIS — F5101 Primary insomnia: Secondary | ICD-10-CM | POA: Diagnosis not present

## 2022-09-29 DIAGNOSIS — M7581 Other shoulder lesions, right shoulder: Secondary | ICD-10-CM | POA: Diagnosis not present

## 2022-09-29 DIAGNOSIS — M7521 Bicipital tendinitis, right shoulder: Secondary | ICD-10-CM | POA: Diagnosis not present

## 2022-10-10 DIAGNOSIS — G4733 Obstructive sleep apnea (adult) (pediatric): Secondary | ICD-10-CM | POA: Diagnosis not present

## 2022-11-10 DIAGNOSIS — G4733 Obstructive sleep apnea (adult) (pediatric): Secondary | ICD-10-CM | POA: Diagnosis not present

## 2022-12-23 DIAGNOSIS — M940 Chondrocostal junction syndrome [Tietze]: Secondary | ICD-10-CM | POA: Diagnosis not present

## 2022-12-23 DIAGNOSIS — R0789 Other chest pain: Secondary | ICD-10-CM | POA: Diagnosis not present

## 2023-01-12 DIAGNOSIS — M1612 Unilateral primary osteoarthritis, left hip: Secondary | ICD-10-CM | POA: Diagnosis not present

## 2023-01-12 DIAGNOSIS — M7581 Other shoulder lesions, right shoulder: Secondary | ICD-10-CM | POA: Diagnosis not present

## 2023-01-12 DIAGNOSIS — M47816 Spondylosis without myelopathy or radiculopathy, lumbar region: Secondary | ICD-10-CM | POA: Diagnosis not present

## 2023-01-17 DIAGNOSIS — M1612 Unilateral primary osteoarthritis, left hip: Secondary | ICD-10-CM | POA: Diagnosis not present

## 2023-01-26 DIAGNOSIS — M47816 Spondylosis without myelopathy or radiculopathy, lumbar region: Secondary | ICD-10-CM | POA: Diagnosis not present

## 2023-01-26 DIAGNOSIS — M6281 Muscle weakness (generalized): Secondary | ICD-10-CM | POA: Diagnosis not present

## 2023-02-09 DIAGNOSIS — M6281 Muscle weakness (generalized): Secondary | ICD-10-CM | POA: Diagnosis not present

## 2023-02-09 DIAGNOSIS — M47816 Spondylosis without myelopathy or radiculopathy, lumbar region: Secondary | ICD-10-CM | POA: Diagnosis not present

## 2023-02-15 DIAGNOSIS — M6281 Muscle weakness (generalized): Secondary | ICD-10-CM | POA: Diagnosis not present

## 2023-02-15 DIAGNOSIS — M47816 Spondylosis without myelopathy or radiculopathy, lumbar region: Secondary | ICD-10-CM | POA: Diagnosis not present

## 2023-03-02 DIAGNOSIS — M6281 Muscle weakness (generalized): Secondary | ICD-10-CM | POA: Diagnosis not present

## 2023-03-02 DIAGNOSIS — M47816 Spondylosis without myelopathy or radiculopathy, lumbar region: Secondary | ICD-10-CM | POA: Diagnosis not present

## 2023-04-06 DIAGNOSIS — G4733 Obstructive sleep apnea (adult) (pediatric): Secondary | ICD-10-CM | POA: Diagnosis not present

## 2023-04-06 DIAGNOSIS — E782 Mixed hyperlipidemia: Secondary | ICD-10-CM | POA: Diagnosis not present

## 2023-04-06 DIAGNOSIS — I10 Essential (primary) hypertension: Secondary | ICD-10-CM | POA: Diagnosis not present

## 2023-04-06 DIAGNOSIS — N1831 Chronic kidney disease, stage 3a: Secondary | ICD-10-CM | POA: Diagnosis not present

## 2023-04-06 DIAGNOSIS — F5101 Primary insomnia: Secondary | ICD-10-CM | POA: Diagnosis not present

## 2023-04-06 DIAGNOSIS — R7302 Impaired glucose tolerance (oral): Secondary | ICD-10-CM | POA: Diagnosis not present

## 2023-04-06 DIAGNOSIS — M129 Arthropathy, unspecified: Secondary | ICD-10-CM | POA: Diagnosis not present

## 2023-04-06 DIAGNOSIS — F17209 Nicotine dependence, unspecified, with unspecified nicotine-induced disorders: Secondary | ICD-10-CM | POA: Diagnosis not present

## 2023-04-13 DIAGNOSIS — Z96652 Presence of left artificial knee joint: Secondary | ICD-10-CM | POA: Diagnosis not present

## 2023-04-13 DIAGNOSIS — S76112A Strain of left quadriceps muscle, fascia and tendon, initial encounter: Secondary | ICD-10-CM | POA: Diagnosis not present

## 2023-04-13 DIAGNOSIS — M47816 Spondylosis without myelopathy or radiculopathy, lumbar region: Secondary | ICD-10-CM | POA: Diagnosis not present

## 2023-04-13 DIAGNOSIS — M1712 Unilateral primary osteoarthritis, left knee: Secondary | ICD-10-CM | POA: Diagnosis not present

## 2023-05-13 DIAGNOSIS — G4733 Obstructive sleep apnea (adult) (pediatric): Secondary | ICD-10-CM | POA: Diagnosis not present

## 2023-06-03 DIAGNOSIS — S9032XA Contusion of left foot, initial encounter: Secondary | ICD-10-CM | POA: Diagnosis not present

## 2023-06-03 DIAGNOSIS — M19072 Primary osteoarthritis, left ankle and foot: Secondary | ICD-10-CM | POA: Diagnosis not present

## 2023-06-03 DIAGNOSIS — I10 Essential (primary) hypertension: Secondary | ICD-10-CM | POA: Diagnosis not present

## 2023-06-03 DIAGNOSIS — M79672 Pain in left foot: Secondary | ICD-10-CM | POA: Diagnosis not present

## 2023-06-22 DIAGNOSIS — L03032 Cellulitis of left toe: Secondary | ICD-10-CM | POA: Diagnosis not present

## 2023-06-23 DIAGNOSIS — M129 Arthropathy, unspecified: Secondary | ICD-10-CM | POA: Diagnosis not present

## 2023-06-23 DIAGNOSIS — I10 Essential (primary) hypertension: Secondary | ICD-10-CM | POA: Diagnosis not present

## 2023-06-23 DIAGNOSIS — E782 Mixed hyperlipidemia: Secondary | ICD-10-CM | POA: Diagnosis not present

## 2023-06-23 DIAGNOSIS — N1831 Chronic kidney disease, stage 3a: Secondary | ICD-10-CM | POA: Diagnosis not present

## 2023-07-04 DIAGNOSIS — L03032 Cellulitis of left toe: Secondary | ICD-10-CM | POA: Diagnosis not present

## 2023-07-13 DIAGNOSIS — M1712 Unilateral primary osteoarthritis, left knee: Secondary | ICD-10-CM | POA: Diagnosis not present

## 2023-08-01 DIAGNOSIS — H6121 Impacted cerumen, right ear: Secondary | ICD-10-CM | POA: Diagnosis not present

## 2023-08-15 DIAGNOSIS — J40 Bronchitis, not specified as acute or chronic: Secondary | ICD-10-CM | POA: Diagnosis not present

## 2023-08-26 DIAGNOSIS — I10 Essential (primary) hypertension: Secondary | ICD-10-CM | POA: Diagnosis not present

## 2023-08-26 DIAGNOSIS — E782 Mixed hyperlipidemia: Secondary | ICD-10-CM | POA: Diagnosis not present

## 2023-08-26 DIAGNOSIS — M129 Arthropathy, unspecified: Secondary | ICD-10-CM | POA: Diagnosis not present

## 2023-08-26 DIAGNOSIS — R7302 Impaired glucose tolerance (oral): Secondary | ICD-10-CM | POA: Diagnosis not present

## 2023-08-26 DIAGNOSIS — N1831 Chronic kidney disease, stage 3a: Secondary | ICD-10-CM | POA: Diagnosis not present

## 2023-10-03 DIAGNOSIS — I10 Essential (primary) hypertension: Secondary | ICD-10-CM | POA: Diagnosis not present

## 2023-10-03 DIAGNOSIS — M79672 Pain in left foot: Secondary | ICD-10-CM | POA: Diagnosis not present

## 2023-10-03 DIAGNOSIS — M722 Plantar fascial fibromatosis: Secondary | ICD-10-CM | POA: Diagnosis not present

## 2023-10-12 DIAGNOSIS — M1711 Unilateral primary osteoarthritis, right knee: Secondary | ICD-10-CM | POA: Diagnosis not present

## 2023-10-12 DIAGNOSIS — M1811 Unilateral primary osteoarthritis of first carpometacarpal joint, right hand: Secondary | ICD-10-CM | POA: Diagnosis not present

## 2023-10-12 DIAGNOSIS — M1812 Unilateral primary osteoarthritis of first carpometacarpal joint, left hand: Secondary | ICD-10-CM | POA: Diagnosis not present

## 2023-10-21 DIAGNOSIS — Z0000 Encounter for general adult medical examination without abnormal findings: Secondary | ICD-10-CM | POA: Diagnosis not present

## 2023-10-21 DIAGNOSIS — I10 Essential (primary) hypertension: Secondary | ICD-10-CM | POA: Diagnosis not present

## 2023-10-21 DIAGNOSIS — N1831 Chronic kidney disease, stage 3a: Secondary | ICD-10-CM | POA: Diagnosis not present

## 2023-10-21 DIAGNOSIS — M129 Arthropathy, unspecified: Secondary | ICD-10-CM | POA: Diagnosis not present

## 2023-10-21 DIAGNOSIS — F5101 Primary insomnia: Secondary | ICD-10-CM | POA: Diagnosis not present

## 2023-10-21 DIAGNOSIS — R7302 Impaired glucose tolerance (oral): Secondary | ICD-10-CM | POA: Diagnosis not present

## 2023-10-21 DIAGNOSIS — G4733 Obstructive sleep apnea (adult) (pediatric): Secondary | ICD-10-CM | POA: Diagnosis not present

## 2023-10-21 DIAGNOSIS — E782 Mixed hyperlipidemia: Secondary | ICD-10-CM | POA: Diagnosis not present

## 2023-10-21 DIAGNOSIS — F17209 Nicotine dependence, unspecified, with unspecified nicotine-induced disorders: Secondary | ICD-10-CM | POA: Diagnosis not present

## 2023-10-24 DIAGNOSIS — M722 Plantar fascial fibromatosis: Secondary | ICD-10-CM | POA: Diagnosis not present

## 2023-10-24 DIAGNOSIS — M79672 Pain in left foot: Secondary | ICD-10-CM | POA: Diagnosis not present

## 2023-11-14 DIAGNOSIS — G4733 Obstructive sleep apnea (adult) (pediatric): Secondary | ICD-10-CM | POA: Diagnosis not present

## 2024-01-11 DIAGNOSIS — M1711 Unilateral primary osteoarthritis, right knee: Secondary | ICD-10-CM | POA: Diagnosis not present

## 2024-01-18 DIAGNOSIS — H9313 Tinnitus, bilateral: Secondary | ICD-10-CM | POA: Diagnosis not present

## 2024-01-18 DIAGNOSIS — H6123 Impacted cerumen, bilateral: Secondary | ICD-10-CM | POA: Diagnosis not present

## 2024-01-18 DIAGNOSIS — H903 Sensorineural hearing loss, bilateral: Secondary | ICD-10-CM | POA: Diagnosis not present

## 2024-02-01 DIAGNOSIS — H903 Sensorineural hearing loss, bilateral: Secondary | ICD-10-CM | POA: Diagnosis not present

## 2024-02-22 DIAGNOSIS — M1711 Unilateral primary osteoarthritis, right knee: Secondary | ICD-10-CM | POA: Diagnosis not present

## 2024-04-11 DIAGNOSIS — M1711 Unilateral primary osteoarthritis, right knee: Secondary | ICD-10-CM | POA: Diagnosis not present

## 2024-04-30 DIAGNOSIS — E782 Mixed hyperlipidemia: Secondary | ICD-10-CM | POA: Diagnosis not present

## 2024-04-30 DIAGNOSIS — F17209 Nicotine dependence, unspecified, with unspecified nicotine-induced disorders: Secondary | ICD-10-CM | POA: Diagnosis not present

## 2024-04-30 DIAGNOSIS — Z1331 Encounter for screening for depression: Secondary | ICD-10-CM | POA: Diagnosis not present

## 2024-04-30 DIAGNOSIS — M129 Arthropathy, unspecified: Secondary | ICD-10-CM | POA: Diagnosis not present

## 2024-04-30 DIAGNOSIS — R7302 Impaired glucose tolerance (oral): Secondary | ICD-10-CM | POA: Diagnosis not present

## 2024-04-30 DIAGNOSIS — I10 Essential (primary) hypertension: Secondary | ICD-10-CM | POA: Diagnosis not present

## 2024-04-30 DIAGNOSIS — N1831 Chronic kidney disease, stage 3a: Secondary | ICD-10-CM | POA: Diagnosis not present

## 2024-04-30 DIAGNOSIS — G4733 Obstructive sleep apnea (adult) (pediatric): Secondary | ICD-10-CM | POA: Diagnosis not present

## 2024-04-30 DIAGNOSIS — F5101 Primary insomnia: Secondary | ICD-10-CM | POA: Diagnosis not present

## 2024-07-11 DIAGNOSIS — M1711 Unilateral primary osteoarthritis, right knee: Secondary | ICD-10-CM | POA: Diagnosis not present
